# Patient Record
Sex: Female | Born: 1970 | Race: White | Hispanic: No | Marital: Married | Smoking: Former smoker
Health system: Southern US, Community
[De-identification: ages and names within clinical notes are randomized; demographics above are authoritative.]

## PROBLEM LIST (undated history)

## (undated) DIAGNOSIS — F32A Depression, unspecified: Secondary | ICD-10-CM

## (undated) DIAGNOSIS — I10 Essential (primary) hypertension: Secondary | ICD-10-CM

## (undated) DIAGNOSIS — I8290 Acute embolism and thrombosis of unspecified vein: Secondary | ICD-10-CM

## (undated) DIAGNOSIS — G43909 Migraine, unspecified, not intractable, without status migrainosus: Secondary | ICD-10-CM

## (undated) DIAGNOSIS — E785 Hyperlipidemia, unspecified: Secondary | ICD-10-CM

## (undated) DIAGNOSIS — F329 Major depressive disorder, single episode, unspecified: Secondary | ICD-10-CM

## (undated) HISTORY — DX: Depression, unspecified: F32.A

## (undated) HISTORY — DX: Hyperlipidemia, unspecified: E78.5

## (undated) HISTORY — DX: Acute embolism and thrombosis of unspecified vein: I82.90

## (undated) HISTORY — PX: TUBAL LIGATION: SHX77

---
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
## 2001-03-27 ENCOUNTER — Other Ambulatory Visit: Admission: RE | Admit: 2001-03-27 | Discharge: 2001-03-27 | Payer: Self-pay | Admitting: Obstetrics & Gynecology

## 2002-07-09 ENCOUNTER — Other Ambulatory Visit: Admission: RE | Admit: 2002-07-09 | Discharge: 2002-07-09 | Payer: Self-pay | Admitting: Obstetrics & Gynecology

## 2006-08-30 ENCOUNTER — Other Ambulatory Visit: Admission: RE | Admit: 2006-08-30 | Discharge: 2006-08-30 | Payer: Self-pay | Admitting: Obstetrics & Gynecology

## 2006-10-22 ENCOUNTER — Ambulatory Visit (HOSPITAL_BASED_OUTPATIENT_CLINIC_OR_DEPARTMENT_OTHER): Admission: RE | Admit: 2006-10-22 | Discharge: 2006-10-22 | Payer: Self-pay | Admitting: Obstetrics & Gynecology

## 2015-10-16 ENCOUNTER — Encounter (HOSPITAL_BASED_OUTPATIENT_CLINIC_OR_DEPARTMENT_OTHER): Payer: Self-pay | Admitting: *Deleted

## 2015-10-16 ENCOUNTER — Emergency Department (HOSPITAL_BASED_OUTPATIENT_CLINIC_OR_DEPARTMENT_OTHER)
Admission: EM | Admit: 2015-10-16 | Discharge: 2015-10-16 | Disposition: A | Discharge status: Home / Self Care | Payer: Managed Care, Other (non HMO) | Attending: Emergency Medicine | Admitting: Emergency Medicine

## 2015-10-16 DIAGNOSIS — G43809 Other migraine, not intractable, without status migrainosus: Secondary | ICD-10-CM | POA: Insufficient documentation

## 2015-10-16 DIAGNOSIS — R51 Headache: Secondary | ICD-10-CM | POA: Diagnosis present

## 2015-10-16 DIAGNOSIS — R6883 Chills (without fever): Secondary | ICD-10-CM | POA: Insufficient documentation

## 2015-10-16 DIAGNOSIS — F17200 Nicotine dependence, unspecified, uncomplicated: Secondary | ICD-10-CM | POA: Diagnosis not present

## 2015-10-16 HISTORY — DX: Migraine, unspecified, not intractable, without status migrainosus: G43.909

## 2015-10-16 MED ORDER — SODIUM CHLORIDE 0.9 % IV BOLUS (SEPSIS)
500.0000 mL | Freq: Once | INTRAVENOUS | Status: AC
  Administered 2015-10-16: 500 mL via INTRAVENOUS

## 2015-10-16 MED ORDER — KETOROLAC TROMETHAMINE 30 MG/ML IJ SOLN
30.0000 mg | Freq: Once | INTRAMUSCULAR | Status: AC
  Administered 2015-10-16: 30 mg via INTRAVENOUS
  Filled 2015-10-16: qty 1

## 2015-10-16 MED ORDER — PROCHLORPERAZINE EDISYLATE 5 MG/ML IJ SOLN
10.0000 mg | Freq: Once | INTRAMUSCULAR | Status: AC
  Administered 2015-10-16: 10 mg via INTRAVENOUS
  Filled 2015-10-16: qty 2

## 2015-10-16 NOTE — ED Notes (Signed)
Denies any nausea at this time

## 2015-10-16 NOTE — ED Notes (Signed)
Sensitive to light

## 2015-10-16 NOTE — ED Provider Notes (Signed)
CSN: [id number]     Arrival date & time 10/16/15  0754 History   First MD Initiated Contact with Patient 10/16/15 0827     Chief Complaint  Patient presents with  . Headache      Patient is a 45 y.o. female presenting with headaches. The history is provided by the patient.  Headache Associated symptoms: photophobia   Associated symptoms: no abdominal pain, no back pain, no diarrhea, no eye pain, no nausea, no neck stiffness, no numbness, no vomiting and no weakness   Patient had a throbbing frontal headache for the last 4 days. Began on Thursday today is Sunday. She began have some vomiting now. History of migraines and states this feels similar to that. No fevers but states she had had some chills. No diarrhea. No visual changes. Slight photophobia. States these headaches are more frequent with her menses.   Past Medical History  Diagnosis Date  . Migraine    Past Surgical History  Procedure Laterality Date  . Tubal ligation     History reviewed. No pertinent family history. Social History  Substance Use Topics  . Smoking status: Current Every Day Smoker -- 0.50 packs/day  . Smokeless tobacco: None  . Alcohol Use: No   OB History    No data available     Review of Systems  Constitutional: Positive for chills. Negative for activity change and appetite change.  Eyes: Positive for photophobia. Negative for pain and visual disturbance.  Respiratory: Negative for chest tightness and shortness of breath.   Cardiovascular: Negative for chest pain and leg swelling.  Gastrointestinal: Negative for nausea, vomiting, abdominal pain and diarrhea.  Genitourinary: Negative for flank pain.  Musculoskeletal: Negative for back pain and neck stiffness.  Skin: Negative for rash.  Neurological: Positive for headaches. Negative for weakness and numbness.  Psychiatric/Behavioral: Negative for behavioral problems.      Allergies  Review of patient's allergies indicates no known  allergies.  Home Medications   Prior to Admission medications   Medication Sig Start Date End Date Taking? Authorizing Provider  promethazine (PHENERGAN) 12.5 MG tablet Take 12.5 mg by mouth every 6 (six) hours as needed for nausea or vomiting.   Yes Historical Provider, MD  SUMAtriptan (IMITREX) 50 MG tablet Take 50 mg by mouth every 2 (two) hours as needed for migraine or headache. May repeat in 2 hours if headache persists or recurs.   Yes Historical Provider, MD   BP 119/72 mmHg  Pulse 84  Temp(Src) 97.6 F (36.4 C) (Oral)  Resp 16  Ht 5\' 6"  (1.676 m)  Wt 160 lb (72.576 kg)  BMI 25.84 kg/m2  SpO2 100% Physical Exam  Constitutional: She appears well-developed and well-nourished.  HENT:  Head: Normocephalic and atraumatic.  Eyes: EOM are normal. Pupils are equal, round, and reactive to light.  Neck: Normal range of motion. Neck supple.  Cardiovascular: Normal rate, regular rhythm and normal heart sounds.   No murmur heard. Pulmonary/Chest: Effort normal and breath sounds normal. No respiratory distress. She has no wheezes. She has no rales.  Abdominal: Soft. Bowel sounds are normal. She exhibits no distension. There is no tenderness.  Musculoskeletal: Normal range of motion.  Neurological: She is alert.  Skin: Skin is warm.  Psychiatric: She has a normal mood and affect. Her speech is normal.  Nursing note and vitals reviewed.   ED Course  Procedures (including critical care time) Labs Review Labs Reviewed - No data to display  Imaging Review No results found.  I have personally reviewed and evaluated these images and lab results as part of my medical decision-making.   EKG Interpretation None      MDM   Final diagnoses:  Other migraine without status migrainosus, not intractable    Patient with headache. Typical migraine. Feels better after treatment will discharge home. Doubt other severe illness.    Davonna Belling, MD 10/16/15 [phone]

## 2015-10-16 NOTE — Discharge Instructions (Signed)

## 2015-10-16 NOTE — ED Notes (Signed)
States had onset of HA this past Thursday, awoke this am with some nausea.

## 2019-07-21 ENCOUNTER — Emergency Department (HOSPITAL_COMMUNITY)
Admission: EM | Admit: 2019-07-21 | Discharge: 2019-07-21 | Disposition: A | Discharge status: Home / Self Care | Payer: Managed Care, Other (non HMO) | Attending: Emergency Medicine | Admitting: Emergency Medicine

## 2019-07-21 ENCOUNTER — Emergency Department (HOSPITAL_COMMUNITY): Payer: Managed Care, Other (non HMO)

## 2019-07-21 ENCOUNTER — Other Ambulatory Visit: Payer: Self-pay

## 2019-07-21 DIAGNOSIS — Z20822 Contact with and (suspected) exposure to covid-19: Secondary | ICD-10-CM | POA: Diagnosis not present

## 2019-07-21 DIAGNOSIS — R079 Chest pain, unspecified: Secondary | ICD-10-CM

## 2019-07-21 DIAGNOSIS — R0789 Other chest pain: Secondary | ICD-10-CM | POA: Insufficient documentation

## 2019-07-21 DIAGNOSIS — R42 Dizziness and giddiness: Secondary | ICD-10-CM | POA: Diagnosis not present

## 2019-07-21 DIAGNOSIS — R5383 Other fatigue: Secondary | ICD-10-CM | POA: Diagnosis not present

## 2019-07-21 DIAGNOSIS — K449 Diaphragmatic hernia without obstruction or gangrene: Secondary | ICD-10-CM | POA: Insufficient documentation

## 2019-07-21 LAB — CBC
HCT: 45 % (ref 36.0–46.0)
Hemoglobin: 14.3 g/dL (ref 12.0–15.0)
MCH: 28.6 pg (ref 26.0–34.0)
MCHC: 31.8 g/dL (ref 30.0–36.0)
MCV: 90 fL (ref 80.0–100.0)
Platelets: 348 10*3/uL (ref 150–400)
RBC: 5 MIL/uL (ref 3.87–5.11)
RDW: 12.9 % (ref 11.5–15.5)
WBC: 12 10*3/uL — ABNORMAL HIGH (ref 4.0–10.5)
nRBC: 0 % (ref 0.0–0.2)

## 2019-07-21 LAB — URINALYSIS, ROUTINE W REFLEX MICROSCOPIC
Bacteria, UA: NONE SEEN
Bilirubin Urine: NEGATIVE
Glucose, UA: NEGATIVE mg/dL
Hgb urine dipstick: NEGATIVE
Ketones, ur: NEGATIVE mg/dL
Nitrite: NEGATIVE
Protein, ur: NEGATIVE mg/dL
Specific Gravity, Urine: 1.019 (ref 1.005–1.030)
pH: 5 (ref 5.0–8.0)

## 2019-07-21 LAB — BASIC METABOLIC PANEL
Anion gap: 12 (ref 5–15)
BUN: 10 mg/dL (ref 6–20)
CO2: 24 mmol/L (ref 22–32)
Calcium: 9.2 mg/dL (ref 8.9–10.3)
Chloride: 103 mmol/L (ref 98–111)
Creatinine, Ser: 0.96 mg/dL (ref 0.44–1.00)
GFR calc Af Amer: >60 mL/min (ref >60)
GFR calc non Af Amer: >60 mL/min (ref >60)
Glucose, Bld: 93 mg/dL (ref 70–99)
Potassium: 3.2 mmol/L — ABNORMAL LOW (ref 3.5–5.1)
Sodium: 139 mmol/L (ref 135–145)

## 2019-07-21 LAB — HEPATIC FUNCTION PANEL
ALT: 22 U/L (ref 0–44)
AST: 20 U/L (ref 15–41)
Albumin: 4.1 g/dL (ref 3.5–5.0)
Alkaline Phosphatase: 54 U/L (ref 38–126)
Bilirubin, Direct: <0.1 mg/dL (ref 0.0–0.2)
Total Bilirubin: 0.8 mg/dL (ref 0.3–1.2)
Total Protein: 8.3 g/dL — ABNORMAL HIGH (ref 6.5–8.1)

## 2019-07-21 LAB — I-STAT BETA HCG BLOOD, ED (MC, WL, AP ONLY): I-stat hCG, quantitative: <5 m[IU]/mL (ref <5)

## 2019-07-21 LAB — TROPONIN I (HIGH SENSITIVITY)
Troponin I (High Sensitivity): <2 ng/L (ref <18)
Troponin I (High Sensitivity): <2 ng/L (ref <18)

## 2019-07-21 LAB — TSH: TSH: 1.807 u[IU]/mL (ref 0.350–4.500)

## 2019-07-21 LAB — RESPIRATORY PANEL BY RT PCR (FLU A&B, COVID)
Influenza A by PCR: NEGATIVE
Influenza B by PCR: NEGATIVE
SARS Coronavirus 2 by RT PCR: NEGATIVE

## 2019-07-21 LAB — LACTIC ACID, PLASMA: Lactic Acid, Venous: 1.2 mmol/L (ref 0.5–1.9)

## 2019-07-21 MED ORDER — ALUM & MAG HYDROXIDE-SIMETH 200-200-20 MG/5ML PO SUSP
30.0000 mL | Freq: Once | ORAL | Status: AC
  Administered 2019-07-21: 30 mL via ORAL
  Filled 2019-07-21: qty 30

## 2019-07-21 MED ORDER — SODIUM CHLORIDE (PF) 0.9 % IJ SOLN
INTRAMUSCULAR | Status: AC
  Filled 2019-07-21: qty 50

## 2019-07-21 MED ORDER — IOHEXOL 350 MG/ML SOLN
100.0000 mL | Freq: Once | INTRAVENOUS | Status: AC | PRN
  Administered 2019-07-21: 100 mL via INTRAVENOUS

## 2019-07-21 MED ORDER — SODIUM CHLORIDE 0.9 % IV BOLUS
1000.0000 mL | Freq: Once | INTRAVENOUS | Status: AC
  Administered 2019-07-21: 1000 mL via INTRAVENOUS

## 2019-07-21 MED ORDER — OMEPRAZOLE 20 MG PO CPDR: 20.0000 mg | DELAYED_RELEASE_CAPSULE | Freq: Every day | ORAL | 0 refills | Status: DC

## 2019-07-21 MED ORDER — SODIUM CHLORIDE 0.9% FLUSH: 3.0000 mL | Freq: Once | INTRAVENOUS | Status: DC

## 2019-07-21 MED ORDER — LIDOCAINE VISCOUS HCL 2 % MT SOLN
15.0000 mL | Freq: Once | OROMUCOSAL | Status: AC
  Administered 2019-07-21: 15 mL via ORAL
  Filled 2019-07-21: qty 15

## 2019-07-21 NOTE — Discharge Instructions (Signed)
Your work-up today was overall reassuring.  You had slightly low potassium but otherwise your labs were reassuring.  We did find evidence of a small hiatal hernia which may have contributed to the chest discomfort and possible reflux.  Please take the Prilosec to help prevent this from worsening and please try and stay hydrated.  Please rest.  Please follow-up with your primary doctor to discuss further anticoagulation management for your DVT.  Fortunately, there was no evidence of blood clot in your lungs at this time.    If any symptoms change or worsen, please return to the nearest emergency department.

## 2019-07-21 NOTE — ED Provider Notes (Signed)
[facility] DEPT Provider Note   CSN: [id number] Arrival date & time: 07/21/19  1431     History Chief Complaint  Patient presents with  . Dizziness    Hx DVT    Jeanne Payne is a 49 y.o. female.  The history is provided by the patient and medical records. No language interpreter was used.  Chest Pain Pain location:  Substernal area Pain quality: aching and pressure   Pain radiates to:  Does not radiate Pain severity:  Moderate Onset quality:  Gradual Duration:  4 days Timing:  Constant Progression:  Waxing and waning Chronicity:  Recurrent Relieved by:  Nothing Worsened by:  Exertion and certain positions Ineffective treatments:  None tried Associated symptoms: cough (dry), fatigue and shortness of breath   Associated symptoms: no abdominal pain, no anxiety, no back pain, no diaphoresis, no dizziness, no fever, no headache, no lower extremity edema, no nausea, no numbness, no palpitations, no vomiting and no weakness   Risk factors: high cholesterol and prior DVT/PE   Risk factors: not female        No past medical history on file.  There are no problems to display for this patient.     OB History   No obstetric history on file.     No family history on file.  Social History   Tobacco Use  . Smoking status: Not on file  Substance Use Topics  . Alcohol use: Not on file  . Drug use: Not on file    Home Medications Prior to Admission medications   Not on File    Allergies    Patient has no allergy information on record.  Review of Systems   Review of Systems  Constitutional: Positive for fatigue. Negative for chills, diaphoresis and fever.  HENT: Negative for congestion.   Eyes: Negative for visual disturbance.  Respiratory: Positive for cough (dry), chest tightness and shortness of breath. Negative for wheezing.   Cardiovascular: Positive for chest pain. Negative for palpitations.  Gastrointestinal:  Negative for abdominal pain, constipation, diarrhea, nausea and vomiting.  Genitourinary: Positive for decreased urine volume. Negative for dysuria and frequency.  Musculoskeletal: Negative for back pain, neck pain and neck stiffness.  Skin: Negative for rash and wound.  Neurological: Positive for light-headedness. Negative for dizziness, syncope, weakness, numbness and headaches.  Psychiatric/Behavioral: Negative for agitation.  All other systems reviewed and are negative.   Physical Exam Updated Vital Signs BP (!) 141/91 (BP Location: Left Arm)   Pulse 79   Temp 98.1 F (36.7 C) (Oral)   Resp 20   Ht 5\' 6"  (1.676 m)   Wt 88.5 kg   SpO2 100%   BMI 31.47 kg/m   Physical Exam Vitals and nursing note reviewed.  Constitutional:      General: She is not in acute distress.    Appearance: Normal appearance. She is well-developed. She is not ill-appearing, toxic-appearing or diaphoretic.  HENT:     Head: Normocephalic and atraumatic.     Right Ear: External ear normal.     Left Ear: External ear normal.     Nose: Nose normal. No congestion or rhinorrhea.     Mouth/Throat:     Mouth: Mucous membranes are dry.     Pharynx: No oropharyngeal exudate or posterior oropharyngeal erythema.  Eyes:     Extraocular Movements: Extraocular movements intact.     Conjunctiva/sclera: Conjunctivae normal.     Pupils: Pupils are equal, round, and reactive to light.  Cardiovascular:     Rate and Rhythm: Normal rate and regular rhythm.     Pulses: Normal pulses.     Heart sounds: No murmur.  Pulmonary:     Effort: Pulmonary effort is normal. No respiratory distress.     Breath sounds: Normal breath sounds. No stridor. No wheezing, rhonchi or rales.  Chest:     Chest wall: No tenderness.  Abdominal:     General: Abdomen is flat. There is no distension.     Tenderness: There is no abdominal tenderness. There is no right CVA tenderness, left CVA tenderness or rebound.  Musculoskeletal:         General: Tenderness present. No signs of injury.     Cervical back: Normal range of motion and neck supple. No tenderness.     Right lower leg: No edema.     Left lower leg: No edema.  Skin:    General: Skin is warm.     Coloration: Skin is not pale.     Findings: No erythema or rash.  Neurological:     General: No focal deficit present.     Mental Status: She is alert and oriented to person, place, and time.     Sensory: No sensory deficit.     Motor: No weakness or abnormal muscle tone.     Deep Tendon Reflexes: Reflexes are normal and symmetric.  Psychiatric:        Mood and Affect: Mood normal.     ED Results / Procedures / Treatments   Labs (all labs ordered are listed, but only abnormal results are displayed) Labs Reviewed  BASIC METABOLIC PANEL - Abnormal; Notable for the following components:      Result Value   Potassium 3.2 (*)    All other components within normal limits  CBC - Abnormal; Notable for the following components:   WBC 12.0 (*)    All other components within normal limits  URINALYSIS, ROUTINE W REFLEX MICROSCOPIC - Abnormal; Notable for the following components:   APPearance HAZY (*)    Leukocytes,Ua SMALL (*)    All other components within normal limits  HEPATIC FUNCTION PANEL - Abnormal; Notable for the following components:   Total Protein 8.3 (*)    All other components within normal limits  RESPIRATORY PANEL BY RT PCR (FLU A&B, COVID)  URINE CULTURE  LACTIC ACID, PLASMA  TSH  I-STAT BETA HCG BLOOD, ED ([facility], [facility], [facility] ONLY)  TROPONIN I (HIGH SENSITIVITY)  TROPONIN I (HIGH SENSITIVITY)    EKG EKG Interpretation  Date/Time:  Tuesday July 21 2019 14:56:27 EST Ventricular Rate:  87 PR Interval:    QRS Duration: 93 QT Interval:  363 QTC Calculation: 437 R Axis:   54 Text Interpretation: Sinus rhythm Low voltage, precordial leads when comapred to prior, less wanderin gbaseline. No STEMI Confirmed by Antony Blackbird [id number]) on 07/21/2019  3:58:08 PM   Radiology CT Angio Chest PE W and/or Wo Contrast  Result Date: 07/21/2019 CLINICAL DATA:  Chest pain and shortness of breath EXAM: CT ANGIOGRAPHY CHEST WITH CONTRAST TECHNIQUE: Multidetector CT imaging of the chest was performed using the standard protocol during bolus administration of intravenous contrast. Multiplanar CT image reconstructions and MIPs were obtained to evaluate the vascular anatomy. CONTRAST:  136mL OMNIPAQUE IOHEXOL 350 MG/ML SOLN COMPARISON:  None. FINDINGS: Cardiovascular: There is no demonstrable pulmonary embolus. There is no thoracic aortic aneurysm or dissection. Visualized great vessels appear normal. There is no pericardial effusion or pericardial thickening. Mediastinum/Nodes: Visualized  thyroid appears unremarkable. No evident thoracic adenopathy. There is a small hiatal hernia. Lungs/Pleura: There is slight scarring in the extreme left apex. No edema or airspace opacity evident. No pleural effusions. Upper Abdomen: Visualized upper abdominal structures appear unremarkable. Musculoskeletal: No blastic or lytic bone lesions. No chest wall lesions. Review of the MIP images confirms the above findings. IMPRESSION: 1. No demonstrable pulmonary embolus. No thoracic aortic aneurysm or dissection. 2.  No edema or airspace opacity.  No pleural effusions. 3.  Small hiatal hernia. 4.  No evident adenopathy. Electronically Signed   By: Lowella Grip III M.D.   On: 07/21/2019 19:50    Procedures Procedures (including critical care time)  Medications Ordered in ED Medications  sodium chloride (PF) 0.9 % injection (has no administration in time range)  sodium chloride 0.9 % bolus 1,000 mL (0 mLs Intravenous Stopped 07/21/19 1947)  alum & mag hydroxide-simeth (MAALOX/MYLANTA) 200-200-20 MG/5ML suspension 30 mL (30 mLs Oral Given 07/21/19 1649)    And  lidocaine (XYLOCAINE) 2 % viscous mouth solution 15 mL (15 mLs Oral Given 07/21/19 1649)  iohexol (OMNIPAQUE) 350 MG/ML  injection 100 mL (100 mLs Intravenous Contrast Given 07/21/19 1937)    ED Course  I have reviewed the triage vital signs and the nursing notes.  Pertinent labs & imaging results that were available during my care of the patient were reviewed by me and considered in my medical decision making (see chart for details).    MDM Rules/Calculators/A&P                      Jeanne Payne is a 49 y.o. 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  Patient reports that she was diagnosed with DVT around 2 weeks ago and has been on Xarelto since.  She reports that several days ago she missed a dose of Xarelto.  She reports that for the last few days she is also had onset of more discomfort in her chest with shortness of breath.  She reports has had this discomfort on and off for weeks but it worsened since she missed a Xarelto dose.  She also says that in the past she has had some thyroid problems but is no longer on Synthroid as her levels "improved".  She says that she is also had malaise and aches all over her body.  She denies any fevers or chills.  She reports that her son's girlfriend had Covid recently but is now back at work.  She denies any other Covid contacts.  She does report a mild dry cough.  she denies any nausea, vomiting, constipation, or diarrhea.  She does report she is had decreased urination and her mouth feels very dry.  She denies any dark tarry stool or history of GI bleeds.  On exam, lungs are clear and chest is slightly tender to palpation.  Abdomen is nontender.  Good pulses in all extremities.  Mild tenderness in her calves but she had good sensation and strength.  Back nontender.  No CVA tenderness.  Vital signs reassuring on arrival.  EKG shows no STEMI.  Clinically I suspect that patient has mild reflux  causing her chest discomfort as she says she has had that in the past and is worse at times when she lays flat.  However, given her recent diagnosis of DVT, missing anticoagulation, and the  discomfort in her chest, shortness of breath, and lightheadedness, we will get CT scan to rule out PE.  We will also get screening labs as her fatigue worsening after strength relative could be indicative of a symptomatic anemia with occult GI bleed.  We will also test her thyroid given her history of this and being off of Synthroid.  We will give her some fluids given the reported lightheadedness and near syncope with standing and ambulation.  We will likely check orthostatics after rehydration.  If work-up is complete reassuring and she is feeling better after rehydration, dissipate discharge home however if we discover abnormalities, will reassess patient.  Patient was feeling better after work-up.  CT scan shows no PE.  Covid test negative.  Delta troponin negative.  Flu test negative.  CT scan did show evidence of small hiatal hernia which on reassessment patient reports he does feel some nausea and some reflux type symptoms at times.  Suspect this may be contributing.  With no evidence of anemia on her blood work and no PE.  We feel she is safe for discharge home.  She will follow-up with PCP understood return precautions.  She was given prescription for Prilosec for the likely reflux and will follow up.  She had no other questions or concerns and was discharged in good condition.  Final Clinical Impression(s) / ED Diagnoses Final diagnoses:  None    Rx / DC Orders ED Discharge Orders    None     Clinical Impression: 1. Chest pain, unspecified type   2. Lightheaded   3. Fatigue, unspecified type   4. Hiatal hernia     Disposition: Discharge  Condition: Good  I have discussed the results, Dx and Tx plan with the pt(& family if present). He/she/they expressed understanding and agree(s) with the  plan. Discharge instructions discussed at great length. Strict return precautions discussed and pt &/or family have verbalized understanding of the instructions. No further questions at time of discharge.    Discharge Medication List as of 07/21/2019  9:46 PM    START taking these medications   Details  omeprazole (PRILOSEC) 20 MG capsule Take 1 capsule (20 mg total) by mouth daily., Starting Tue 07/21/2019, Print        Follow Up: [facility] [address] [place] [phone] Schedule an appointment as soon as possible for a visit    [facility] [place] [id number] [place]       Hajer Dwyer, Gwenyth Allegra, MD 07/22/19 [phone]

## 2019-07-21 NOTE — ED Triage Notes (Addendum)
Pt arrived ambulatory into the ED from home CC dizziness, fatigue, left arm pain X2 days. Pt reports "concern of blood clot moving". Pt reports when going from sitting to standing feels like she almost passes out "esp in the shower this morning". A/oX4   Hx [place] blood clot right leg X 2 weeks PCP aware and prescribed Xerolto. High cholesterol.

## 2019-07-22 ENCOUNTER — Encounter: Payer: Self-pay | Admitting: Family Medicine

## 2019-07-22 ENCOUNTER — Encounter (HOSPITAL_BASED_OUTPATIENT_CLINIC_OR_DEPARTMENT_OTHER): Payer: Self-pay | Admitting: *Deleted

## 2019-07-22 ENCOUNTER — Ambulatory Visit: Payer: Managed Care, Other (non HMO) | Admitting: Family Medicine

## 2019-07-22 VITALS — BP 124/70 | HR 88 | Resp 12 | Ht 66.0 in | Wt 202.0 lb

## 2019-07-22 DIAGNOSIS — E785 Hyperlipidemia, unspecified: Secondary | ICD-10-CM | POA: Diagnosis not present

## 2019-07-22 DIAGNOSIS — M25572 Pain in left ankle and joints of left foot: Secondary | ICD-10-CM | POA: Diagnosis not present

## 2019-07-22 DIAGNOSIS — I824Y1 Acute embolism and thrombosis of unspecified deep veins of right proximal lower extremity: Secondary | ICD-10-CM

## 2019-07-22 DIAGNOSIS — E876 Hypokalemia: Secondary | ICD-10-CM

## 2019-07-22 LAB — URINE CULTURE: Culture: NO GROWTH

## 2019-07-22 MED ORDER — RIVAROXABAN 20 MG PO TABS: 20.0000 mg | ORAL_TABLET | Freq: Every day | ORAL | 0 refills | Status: DC

## 2019-07-22 NOTE — Patient Instructions (Signed)
A few things to remember from today's visit:   Hypokalemia  Hyperlipidemia, unspecified hyperlipidemia type  Left ankle pain, unspecified chronicity  Acute deep vein thrombosis (DVT) of proximal end of right lower extremity (HCC)   Deep Vein Thrombosis  Deep vein thrombosis (DVT) is a condition in which a blood clot forms in a deep vein, such as a lower leg, thigh, or arm vein. A clot is blood that has thickened into a gel or solid. This condition is dangerous. It can lead to serious and even life-threatening complications if the clot travels to the lungs and causes a blockage (pulmonary embolism). It can also damage veins in the leg. This can result in leg pain, swelling, discoloration, and sores (post-thrombotic syndrome). What are the causes? This condition may be caused by:  A slowdown of blood flow.  Damage to a vein.  A condition that causes blood to clot more easily, such as an inherited clotting disorder. What increases the risk? The following factors may make you more likely to develop this condition:  Being overweight.  Being older, especially over age 14.  Sitting or lying down for more than four hours.  Being in the hospital.  Lack of physical activity (sedentary lifestyle).  Pregnancy, being in childbirth, or having recently given birth.  Taking medicines that contain estrogen, such as medicines to prevent pregnancy.  Smoking.  A history of any of the following: ? Blood clots or a blood clotting disease. ? Peripheral vascular disease. ? Inflammatory bowel disease. ? Cancer. ? Heart disease. ? Genetic conditions that affect how your blood clots, such as Factor V Leiden mutation. ? Neurological diseases that affect your legs (leg paresis). ? A recent injury, such as a car accident. ? Major or lengthy surgery. ? A central line placed inside a large vein. What are the signs or symptoms? Symptoms of this condition include:  Swelling, pain, or tenderness  in an arm or leg.  Warmth, redness, or discoloration in an arm or leg. If the clot is in your leg, symptoms may be more noticeable or worse when you stand or walk. Some people may not develop any symptoms. How is this diagnosed? This condition is diagnosed with:  A medical history and physical exam.  Tests, such as: ? Blood tests. These are done to check how well your blood clots. ? Ultrasound. This is done to check for clots. ? Venogram. For this test, contrast dye is injected into a vein and X-rays are taken to check for any clots. How is this treated? Treatment for this condition depends on:  The cause of your DVT.  Your risk for bleeding or developing more clots.  Any other medical conditions that you have. Treatment may include:  Taking a blood thinner (anticoagulant). This type of medicine prevents clots from forming. It may be taken by mouth, injected under the skin, or injected through an IV (catheter).  Injecting clot-dissolving medicines into the affected vein (catheter-directed thrombolysis).  Having surgery. Surgery may be done to: ? Remove the clot. ? Place a filter in a large vein to catch blood clots before they reach the lungs. Some treatments may be continued for up to six months. Follow these instructions at home: If you are taking blood thinners:  Take the medicine exactly as told by your health care provider. Some blood thinners need to be taken at the same time every day. Do not skip a dose.  Talk with your health care provider before you take any medicines  that contain aspirin or NSAIDs. These medicines increase your risk for dangerous bleeding.  Ask your health care provider about foods and drugs that could change the way the medicine works (may interact). Avoid those things if your health care provider tells you to do so.  Blood thinners can cause easy bruising and may make it difficult to stop bleeding. Because of this: ? Be very careful when using  knives, scissors, or other sharp objects. ? Use an electric razor instead of a blade. ? Avoid activities that could cause injury or bruising, and follow instructions about how to prevent falls.  Wear a medical alert bracelet or carry a card that lists what medicines you take. General instructions  Take over-the-counter and prescription medicines only as told by your health care provider.  Return to your normal activities as told by your health care provider. Ask your health care provider what activities are safe for you.  Wear compression stockings if recommended by your health care provider.  Keep all follow-up visits as told by your health care provider. This is important. How is this prevented? To lower your risk of developing this condition again:  For 30 or more minutes every day, do an activity that: ? Involves moving your arms and legs. ? Increases your heart rate.  When traveling for longer than four hours: ? Exercise your arms and legs every hour. ? Drink plenty of water. ? Avoid drinking alcohol.  Avoid sitting or lying for a long time without moving your legs.  If you have surgery or you are hospitalized, ask about ways to prevent blood clots. These may include taking frequent walks or using anticoagulants.  Stay at a healthy weight.  If you are a woman who is older than age 41, avoid unnecessary use of medicines that contain estrogen, such as some birth control pills.  Do not use any products that contain nicotine or tobacco, such as cigarettes and e-cigarettes. This is especially important if you take estrogen medicines. If you need help quitting, ask your health care provider. Contact a health care provider if:  You miss a dose of your blood thinner.  Your menstrual period is heavier than usual.  You have unusual bruising. Get help right away if:  You have: ? New or increased pain, swelling, or redness in an arm or leg. ? Numbness or tingling in an arm or  leg. ? Shortness of breath. ? Chest pain. ? A rapid or irregular heartbeat. ? A severe headache or confusion. ? A cut that will not stop bleeding.  There is blood in your vomit, stool, or urine.  You have a serious fall or accident, or you hit your head.  You feel light-headed or dizzy.  You cough up blood. These symptoms may represent a serious problem that is an emergency. Do not wait to see if the symptoms will go away. Get medical help right away. Call your local emergency services (911 in the [place] Do not drive yourself to the hospital. Summary  Deep vein thrombosis (DVT) is a condition in which a blood clot forms in a deep vein, such as a lower leg, thigh, or arm vein.  Symptoms can include swelling, warmth, pain, and redness in your leg or arm.  This condition may be treated with a blood thinner (anticoagulant medicine), medicine that is injected to dissolve blood clots,compression stockings, or surgery.  If you are prescribed blood thinners, take them exactly as told. This information is not intended to replace advice  given to you by your health care provider. Make sure you discuss any questions you have with your health care provider. Document Revised: 05/24/2017 Document Reviewed: 11/09/2016 Elsevier Patient Education  [employer].  Please be sure medication list is accurate. If a new problem present, please set up appointment sooner than planned today.

## 2019-07-22 NOTE — Progress Notes (Signed)
HPI:   [name] is a 49 y.o. female, who is here today to establish care.  Former PCP: Dr Benjamine Mola. Last preventive routine visit: 05/2019.  Chronic medical problems:Migraines,HLD,and depression among some.  Recently dx'ed with DVT She presented to her pcp c/o LE pain L>R, that was going on for a few weeks, since 04/2019.  07/09/19 LE vein US showed RLE proximal DVT. She is on Xarelto 15 mg bid.  No known risk factors for DVT. No prior hx. No tobacco use.  Negative for travel,long period of immobility,surgery,hormonal treatment,or leg trauma. She did not have edema or erythema on area affected. She is up to date with all cancer screening prevention.  Father with Hx of PE. She was evaluated recently in the ER due to 4 days of CP, 07/21/19.  Substernal achy/presure CP,not radiated,intermittent, and associated with cough,SOB,and fatigue. Today symptoms have resolved. Lab Results  Component Value Date   CREATININE 0.96 07/21/2019   BUN 10 07/21/2019   NA 139 07/21/2019   K 3.2 (L) 07/21/2019   CL 103 07/21/2019   CO2 24 07/21/2019   Lab Results  Component Value Date   TSH 1.807 07/21/2019   Lab Results  Component Value Date   WBC 12.0 (H) 07/21/2019   HGB 14.3 07/21/2019   HCT 45.0 07/21/2019   MCV 90.0 07/21/2019   PLT 348 07/21/2019   Lab Results  Component Value Date   ALT 22 07/21/2019   AST 20 07/21/2019   ALKPHOS 54 07/21/2019   BILITOT 0.8 07/21/2019   Chest CTA negative for PE. 1. No demonstrable pulmonary embolus. No thoracic aortic aneurysm or dissection. 2.  No edema or airspace opacity.  No pleural effusions. 3.  Small hiatal hernia. 4.  No evident adenopathy.  EKG: SR,normal axis, ? IVCD. No other EKG for comparison.  -She has had left ankle pain, mild, and intermittent. No edema,erythema,or limitation of ROM. No hx of trauma. Exacerbated by certain movements and alleviated by rest.  HLD: Last FLP 05/2019. She  is on Lipitor 40 mg daily. Negative for dyspnea, palpitation, claudication, focal weakness, or urinary symptoms.   Review of Systems  Constitutional: Negative for activity change, appetite change, fatigue and fever.  HENT: Negative for mouth sores, nosebleeds and sore throat.   Eyes: Negative for redness and visual disturbance.  Respiratory: Negative for cough and wheezing.   Gastrointestinal: Negative for abdominal pain, nausea and vomiting.       Negative for changes in bowel habits.  Genitourinary: Negative for decreased urine volume and hematuria.  Neurological: Negative for syncope, numbness and headaches.  Rest see pertinent positives and negatives per HPI.   Current Outpatient Medications on File Prior to Visit  Medication Sig Dispense Refill  . atorvastatin (LIPITOR) 40 MG tablet Take 40 mg by mouth daily.    . cholecalciferol (VITAMIN D3) 25 MCG (1000 UNIT) tablet Take 1,000 Units by mouth daily.    Marland Kitchen omeprazole (PRILOSEC) 20 MG capsule Take 1 capsule (20 mg total) by mouth daily. 30 capsule 0  . Rivaroxaban (XARELTO) 15 MG TABS tablet Take 15 mg by mouth 2 (two) times daily with a meal.    . SUMAtriptan (IMITREX) 25 MG tablet Take 25 mg by mouth every 2 (two) hours as needed for migraine. May repeat in 2 hours if headache persists or recurs.    . topiramate (TOPAMAX) 50 MG tablet Take 50 mg by mouth every evening.    . promethazine (PHENERGAN) 12.5  MG tablet Take 12.5 mg by mouth every 6 (six) hours as needed for nausea or vomiting.    . SUMAtriptan (IMITREX) 50 MG tablet Take 50 mg by mouth every 2 (two) hours as needed for migraine or headache. May repeat in 2 hours if headache persists or recurs.     No current facility-administered medications on file prior to visit.     Past Medical History:  Diagnosis Date  . Depression   . Hyperlipidemia   . Migraine    No Known Allergies  Family History  Problem Relation Age of Onset  . Hypertension Mother   . Depression  Mother   . Alcohol abuse Father   . Asthma Father   . Cancer Father   . COPD Father   . Diabetes Father   . Hearing loss Father   . Heart disease Father   . Depression Sister   . Early death Sister   . Alcohol abuse Brother   . Drug abuse Brother   . Arthritis Paternal Grandmother   . COPD Paternal Grandmother   . Hearing loss Paternal Grandmother     Social History   Socioeconomic History  . Marital status: Married    Spouse name: Not on file  . Number of children: Not on file  . Years of education: Not on file  . Highest education level: Not on file  Occupational History  . Not on file  Tobacco Use  . Smoking status: Former Research scientist (life sciences)  . Smokeless tobacco: Never Used  Substance and Sexual Activity  . Alcohol use: No  . Drug use: Not on file  . Sexual activity: Not on file  Other Topics Concern  . Not on file  Social History Narrative   ** Merged History Encounter **       Social Determinants of Health   Financial Resource Strain:   . Difficulty of Paying Living Expenses: Not on file  Food Insecurity:   . Worried About Charity fundraiser in the Last Year: Not on file  . Ran Out of Food in the Last Year: Not on file  Transportation Needs:   . Lack of Transportation (Medical): Not on file  . Lack of Transportation (Non-Medical): Not on file  Physical Activity:   . Days of Exercise per Week: Not on file  . Minutes of Exercise per Session: Not on file  Stress:   . Feeling of Stress : Not on file  Social Connections:   . Frequency of Communication with Friends and Family: Not on file  . Frequency of Social Gatherings with Friends and Family: Not on file  . Attends Religious Services: Not on file  . Active Member of Clubs or Organizations: Not on file  . Attends Archivist Meetings: Not on file  . Marital Status: Not on file    Vitals:   07/22/19 1119  BP: 124/70  Pulse: 88  Resp: 12  SpO2: 97%    Body mass index is 32.6 kg/m.  Physical  Exam  Nursing note and vitals reviewed. Constitutional: She is oriented to person, place, and time. She appears well-developed. No distress.  HENT:  Head: Normocephalic and atraumatic.  Mouth/Throat: Oropharynx is clear and moist and mucous membranes are normal.  Eyes: Pupils are equal, round, and reactive to light. Conjunctivae are normal.  Cardiovascular: Normal rate and regular rhythm.  No murmur heard. Pulses:      Dorsalis pedis pulses are 2+ on the right side and 2+ on the  left side.  Respiratory: Effort normal and breath sounds normal. No respiratory distress.  GI: Soft. She exhibits no mass. There is no hepatomegaly. There is no abdominal tenderness.  Musculoskeletal:        General: No edema.     Left ankle: No swelling or deformity. Tenderness present over the medial malleolus, AITF ligament and posterior TF ligament. No proximal fibula tenderness. Normal range of motion.     Left Achilles Tendon: No pain or defects. Thompson's test negative.  Lymphadenopathy:    She has no cervical adenopathy.  Neurological: She is alert and oriented to person, place, and time. She has normal strength. No cranial nerve deficit. Gait normal.  Skin: Skin is warm. No rash noted. No erythema.  Psychiatric: She has a normal mood and affect.  Well groomed, good eye contact.   ASSESSMENT AND PLAN:  Ms. Katherene was seen today for establish care and discuss dvt.  Diagnoses and all orders for this visit:  Acute deep vein thrombosis (DVT) of proximal end of right lower extremity (HCC) No known risk factors. We discussed recommendations in regard to anticoagulation and side effects. We will plan on 6 months for now. Next visit will discuss the need for evaluating prot S and C deficiency and Leiden V mutation.  -     rivaroxaban (XARELTO) 20 MG TABS tablet; Take 1 tablet (20 mg total) by mouth daily with supper.  Hypokalemia Mild. K+ rich diet recommended for now.  Hyperlipidemia, unspecified  hyperlipidemia type We will try to obtain copy of last done in 05/2019. Continue Atorvastatin 40 mg daily. F/U in 05/2020.  Left ankle pain, unspecified chronicity ? Ankle sprain. I do not think imaging is needed at this time. ABC PT exercises recommended.   Return in about 3 months (around 10/20/2019).   Jeanne Payne G. [place], MD  [facility]. [place] office.

## 2019-10-19 ENCOUNTER — Other Ambulatory Visit: Payer: Self-pay

## 2019-10-20 ENCOUNTER — Encounter: Payer: Self-pay | Admitting: Family Medicine

## 2019-10-20 ENCOUNTER — Ambulatory Visit (INDEPENDENT_AMBULATORY_CARE_PROVIDER_SITE_OTHER): Payer: Managed Care, Other (non HMO) | Admitting: Family Medicine

## 2019-10-20 VITALS — BP 118/70 | HR 84 | Temp 97.9°F | Resp 12 | Ht 66.0 in | Wt 205.1 lb

## 2019-10-20 DIAGNOSIS — M79605 Pain in left leg: Secondary | ICD-10-CM

## 2019-10-20 DIAGNOSIS — G43109 Migraine with aura, not intractable, without status migrainosus: Secondary | ICD-10-CM

## 2019-10-20 DIAGNOSIS — E876 Hypokalemia: Secondary | ICD-10-CM

## 2019-10-20 DIAGNOSIS — K219 Gastro-esophageal reflux disease without esophagitis: Secondary | ICD-10-CM | POA: Diagnosis not present

## 2019-10-20 DIAGNOSIS — E785 Hyperlipidemia, unspecified: Secondary | ICD-10-CM

## 2019-10-20 DIAGNOSIS — M79604 Pain in right leg: Secondary | ICD-10-CM

## 2019-10-20 DIAGNOSIS — F419 Anxiety disorder, unspecified: Secondary | ICD-10-CM

## 2019-10-20 LAB — COMPREHENSIVE METABOLIC PANEL
ALT: 18 U/L (ref 0–35)
AST: 20 U/L (ref 0–37)
Albumin: 3.9 g/dL (ref 3.5–5.2)
Alkaline Phosphatase: 50 U/L (ref 39–117)
BUN: 12 mg/dL (ref 6–23)
CO2: 26 mEq/L (ref 19–32)
Calcium: 9.2 mg/dL (ref 8.4–10.5)
Chloride: 105 mEq/L (ref 96–112)
Creatinine, Ser: 0.85 mg/dL (ref 0.40–1.20)
GFR: 71.25 mL/min (ref >60.00)
Glucose, Bld: 93 mg/dL (ref 70–99)
Potassium: 4.4 mEq/L (ref 3.5–5.1)
Sodium: 139 mEq/L (ref 135–145)
Total Bilirubin: 0.2 mg/dL (ref 0.2–1.2)
Total Protein: 6.9 g/dL (ref 6.0–8.3)

## 2019-10-20 LAB — LIPID PANEL
Cholesterol: 211 mg/dL — ABNORMAL HIGH (ref 0–200)
HDL: 44.2 mg/dL (ref >39.00)
LDL Cholesterol: 133 mg/dL — ABNORMAL HIGH (ref 0–99)
NonHDL: 166.55
Total CHOL/HDL Ratio: 5
Triglycerides: 170 mg/dL — ABNORMAL HIGH (ref 0.0–149.0)
VLDL: 34 mg/dL (ref 0.0–40.0)

## 2019-10-20 LAB — CBC
HCT: 41.6 % (ref 36.0–46.0)
Hemoglobin: 14 g/dL (ref 12.0–15.0)
MCHC: 33.7 g/dL (ref 30.0–36.0)
MCV: 88.1 fl (ref 78.0–100.0)
Platelets: 283 10*3/uL (ref 150.0–400.0)
RBC: 4.72 Mil/uL (ref 3.87–5.11)
RDW: 14.1 % (ref 11.5–15.5)
WBC: 7.5 10*3/uL (ref 4.0–10.5)

## 2019-10-20 MED ORDER — ESOMEPRAZOLE MAGNESIUM 40 MG PO CPDR: 40.0000 mg | DELAYED_RELEASE_CAPSULE | Freq: Every day | ORAL | 3 refills | Status: DC

## 2019-10-20 MED ORDER — SUMATRIPTAN SUCCINATE 100 MG PO TABS: 100.0000 mg | ORAL_TABLET | Freq: Every day | ORAL | 3 refills | Status: DC | PRN

## 2019-10-20 MED ORDER — ESCITALOPRAM OXALATE 20 MG PO TABS: 20.0000 mg | ORAL_TABLET | Freq: Every day | ORAL | 1 refills | Status: DC

## 2019-10-20 NOTE — Patient Instructions (Signed)
Today Nexium added. No changes in rest of medications.  GERD:  Avoid foods that make your symptoms worse, for example coffee, chocolate,pepermeint,alcohol, and greasy food. Raising the head of your bed about 6 inches may help with nocturnal symptoms.  Avoid tobacco use. Weight loss (if you are overweight). Avoid lying down for 3 hours after eating.  Instead 3 large meals daily try small and more frequent meals during the day.   You should be evaluated immediately if bloody vomiting, bloody stools, black stools (like tar), difficulty swallowing, food gets stuck on the way down or choking when eating. Abnormal weight loss or severe abdominal pain.  If symptoms are not resolved sometimes endoscopy is necessary.

## 2019-10-20 NOTE — Progress Notes (Signed)
HPI:  Jeanne Payne is a 49 y.o. female, who is here today for 3-4 months follow up.   She was last seen on 07/22/19.  -Requesting refills on Lexapro 20 mg. Hx of depression and anxiety. She has been on medication intermittently for years, stopped it 2-3 years ago,did not fell she longer needed it. Worsening anxiety. She was on Prozac before, did not help.  -Migraine headaches: She takes Imitrex 100 mg daily as needed. She is on Topamax 100 mg daily.  Headache exacerbated by lack of good sleep, sudden weather changes,stress,and/or around menstrual periods (lasts about 2 days.) Usually right temporo-frontal throbbing/sharp headache, 5/10. If she does not take Imitrex she will have nausea and sometimes vomiting. Headache has improved through the years. Years ago she had visual aura with every migraine,now it is seldom. Alleviated by sleeping in a dark/quite room. Takes Phenergan for nausea,not frequent because it causes drowsiness. She has seen neurologist a few years ago.  -LE pain, chronic. 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 L>R for about 5-6 months. She has not noted edema or erythema. Exacerbated by prolonged standing/waking. Alleviated by rest.  Negative for CP,palpitation,or dyspnea. No LE erythema.  -Getting "chocked" and retrosternal chest discomfort when swallowing medications. +Heartburn. She took Prilosec 40 mg ,did not help. She has tried Protonix , did not help either.  Problem exacerbated by certain foods.  Eating dinner late at night and sometimes goes to be < 3 hours after. She has not noted melena or abdominal pain.  HLD:She is on non pharmacologic treatment. She is on Lipitor 40 mg daily. Tolerating medication well.  HypoK+;  Component     Latest Ref Rng & Units 07/21/2019  Sodium     135 - 145 mEq/L 139  Potassium     3.5 - 5.1 mEq/L 3.2 (L)  Chloride     96 - 112 mEq/L 103  CO2     19 - 32 mEq/L  24  Glucose     70 - 99 mg/dL 93  BUN     6 - 23 mg/dL 10  Creatinine     0.40 - 1.20 mg/dL 0.96  Calcium     8.4 - 10.5 mg/dL 9.2  GFR, Est Non African American     >60 mL/min >60  GFR, Est African American     >60 mL/min >60  Anion gap     5 - 15 12    Review of Systems  Constitutional: Negative for activity change, appetite change, fatigue and fever.  HENT: Negative for mouth sores, nosebleeds and sore throat.   Eyes: Positive for photophobia. Negative for redness.  Respiratory: Negative for cough and wheezing.   Gastrointestinal: Negative for blood in stool.       Negative for changes in bowel habits.  Musculoskeletal: Negative for back pain, gait problem and myalgias.  Neurological: Negative for syncope, facial asymmetry, weakness and numbness.  Psychiatric/Behavioral: Negative for confusion. The patient is nervous/anxious.   Rest of ROS, see pertinent positives sand negatives in HPI  Current Outpatient Medications on File Prior to Visit  Medication Sig Dispense Refill  . atorvastatin (LIPITOR) 40 MG tablet Take 40 mg by mouth daily.    . rivaroxaban (XARELTO) 20 MG TABS tablet Take 1 tablet (20 mg total) by mouth daily with supper. 90 tablet 0  . topiramate (TOPAMAX) 100 MG tablet Take 100 mg by mouth daily.     No current  facility-administered medications on file prior to visit.   Past Medical History:  Diagnosis Date  . Depression   . Hyperlipidemia   . Migraine    No Known Allergies  Social History   Socioeconomic History  . Marital status: Married    Spouse name: Not on file  . Number of children: Not on file  . Years of education: Not on file  . Highest education level: Not on file  Occupational History  . Not on file  Tobacco Use  . Smoking status: Former Research scientist (life sciences)  . Smokeless tobacco: Never Used  Substance and Sexual Activity  . Alcohol use: No  . Drug use: Not on file  . Sexual activity: Not on file  Other Topics Concern  . Not on file    Social History Narrative   ** Merged History Encounter **       Social Determinants of Health   Financial Resource Strain:   . Difficulty of Paying Living Expenses:   Food Insecurity:   . Worried About Charity fundraiser in the Last Year:   . Arboriculturist in the Last Year:   Transportation Needs:   . Film/video editor (Medical):   Marland Kitchen Lack of Transportation (Non-Medical):   Physical Activity:   . Days of Exercise per Week:   . Minutes of Exercise per Session:   Stress:   . Feeling of Stress :   Social Connections:   . Frequency of Communication with Friends and Family:   . Frequency of Social Gatherings with Friends and Family:   . Attends Religious Services:   . Active Member of Clubs or Organizations:   . Attends Archivist Meetings:   Marland Kitchen Marital Status:     Vitals:   10/20/19 0828  BP: 118/70  Pulse: 84  Resp: 12  Temp: 97.9 F (36.6 C)  SpO2: 99%   Body mass index is 33.11 kg/m.   Physical Exam  Nursing note and vitals reviewed. Constitutional: She is oriented to person, place, and time. She appears well-developed. No distress.  HENT:  Head: Normocephalic and atraumatic.  Mouth/Throat: Oropharynx is clear and moist and mucous membranes are normal.  Eyes: Pupils are equal, round, and reactive to light. Conjunctivae are normal.  Cardiovascular: Normal rate and regular rhythm.  No murmur heard. Pulses:      Dorsalis pedis pulses are 2+ on the right side and 2+ on the left side.  Respiratory: Effort normal and breath sounds normal. No respiratory distress.  GI: Soft. She exhibits no mass. There is no hepatomegaly. There is no abdominal tenderness.  Musculoskeletal:        General: No edema.  Lymphadenopathy:    She has no cervical adenopathy.  Neurological: She is alert and oriented to person, place, and time. She has normal strength. No cranial nerve deficit. Gait normal.  Skin: Skin is warm. No rash noted. No erythema.  Psychiatric: Her  mood appears anxious.  Well groomed, good eye contact.   ASSESSMENT AND PLAN:  Jeanne Payne was seen today for 3-4 months follow-up.  Orders Placed This Encounter  Procedures  . Comprehensive metabolic panel  . Lipid panel  . CBC   Lab Results  Component Value Date   WBC 7.5 10/20/2019   HGB 14.0 10/20/2019   HCT 41.6 10/20/2019   MCV 88.1 10/20/2019   PLT 283.0 10/20/2019   Lab Results  Component Value Date   CHOL 211 (H) 10/20/2019   HDL  44.20 10/20/2019   LDLCALC 133 (H) 10/20/2019   TRIG 170.0 (H) 10/20/2019   CHOLHDL 5 10/20/2019   Lab Results  Component Value Date   ALT 18 10/20/2019   AST 20 10/20/2019   ALKPHOS 50 10/20/2019   BILITOT 0.2 10/20/2019   Lab Results  Component Value Date   CREATININE 0.85 10/20/2019   BUN 12 10/20/2019   NA 139 10/20/2019   K 4.4 10/20/2019   CL 105 10/20/2019   CO2 26 10/20/2019    1. Migraine with aura and without status migrainosus, not intractable Problem is stable. No changes in Topamax or Imitrex. Stop Phenergan and try Zofran instead.  - SUMAtriptan (IMITREX) 100 MG tablet; Take 1 tablet (100 mg total) by mouth daily as needed for migraine.  Dispense: 10 tablet; Refill: 3 - CBC - ondansetron (ZOFRAN) 4 MG tablet; Take 1 tablet (4 mg total) by mouth daily as needed for nausea or vomiting (migraine).  Dispense: 15 tablet; Refill: 0  2. Hyperlipidemia, unspecified hyperlipidemia type Continue Atorvastatin 40 mg daily.  Further recommendations according to lab results.  The 10-year ASCVD risk score Mikey Bussing DC Brooke Bonito., et al., 2013) is: 1.3%   Values used to calculate the score:     Age: 58 years     Sex: Female     Is Non-Hispanic African American: No     Diabetic: No     Tobacco smoker: No     Systolic Blood Pressure: 123456 mmHg     Is BP treated: No     HDL Cholesterol: 44.2 mg/dL     Total Cholesterol: 211 mg/dL  3. Hypokalemia K+ rich diet for now. Further recommendations according to K+  results.  4. Gastroesophageal reflux disease, unspecified whether esophagitis present GERD precautions recommended. Omeprazole and Protonix did not help. Nexium 40 mg 30 min before breakfast.  - esomeprazole (NEXIUM) 40 MG capsule; Take 1 capsule (40 mg total) by mouth daily.  Dispense: 30 capsule; Refill: 3  5. Anxiety disorder, unspecified type Not well controlled. Resume Lexapro 20 mg daily,she can start with 1/2 tab for a few days and increased dose if well  - escitalopram (LEXAPRO) 20 MG tablet; Take 1 tablet (20 mg total) by mouth daily.  Dispense: 90 tablet; Refill: 1  6. Pain in both lower extremities Stable. ? Vein insufficiency. Recommend wearing compression stocking.  Monitor for new symptoms.   Return in about 3 months (around 01/19/2020).    Raylee Strehl G. Martinique, MD  [facility]. [place] office.

## 2019-10-22 ENCOUNTER — Encounter: Payer: Self-pay | Admitting: Family Medicine

## 2019-10-22 MED ORDER — ONDANSETRON HCL 4 MG PO TABS: 4.0000 mg | ORAL_TABLET | Freq: Every day | ORAL | 0 refills | Status: DC | PRN

## 2019-12-14 ENCOUNTER — Other Ambulatory Visit: Payer: Self-pay | Admitting: Family Medicine

## 2019-12-14 ENCOUNTER — Telehealth: Payer: Self-pay | Admitting: Family Medicine

## 2019-12-14 NOTE — Telephone Encounter (Signed)
Pt need a refill on medication  topiramate (TOPAMAX) 100 MG tablet  [id number] - [address], [place] AT [address]  Phone:  [phone] Fax:  [phone]

## 2019-12-14 NOTE — Telephone Encounter (Signed)
Message sent to PCP for review and approval.

## 2019-12-18 NOTE — Telephone Encounter (Signed)
Rx was sent. ?Thanks, ?BJ ?

## 2020-01-19 ENCOUNTER — Ambulatory Visit: Payer: Managed Care, Other (non HMO) | Admitting: Family Medicine

## 2020-01-25 ENCOUNTER — Encounter: Payer: Self-pay | Admitting: Family Medicine

## 2020-01-25 ENCOUNTER — Ambulatory Visit: Payer: Managed Care, Other (non HMO) | Admitting: Family Medicine

## 2020-01-25 ENCOUNTER — Other Ambulatory Visit: Payer: Self-pay

## 2020-01-25 VITALS — BP 126/70 | HR 95 | Temp 97.7°F | Resp 16 | Ht 66.0 in | Wt 205.5 lb

## 2020-01-25 DIAGNOSIS — E66811 Obesity, class 1: Secondary | ICD-10-CM | POA: Insufficient documentation

## 2020-01-25 DIAGNOSIS — N938 Other specified abnormal uterine and vaginal bleeding: Secondary | ICD-10-CM

## 2020-01-25 DIAGNOSIS — K219 Gastro-esophageal reflux disease without esophagitis: Secondary | ICD-10-CM | POA: Diagnosis not present

## 2020-01-25 DIAGNOSIS — N644 Mastodynia: Secondary | ICD-10-CM

## 2020-01-25 DIAGNOSIS — Z6833 Body mass index (BMI) 33.0-33.9, adult: Secondary | ICD-10-CM | POA: Insufficient documentation

## 2020-01-25 DIAGNOSIS — R5382 Chronic fatigue, unspecified: Secondary | ICD-10-CM | POA: Diagnosis not present

## 2020-01-25 DIAGNOSIS — E669 Obesity, unspecified: Secondary | ICD-10-CM

## 2020-01-25 NOTE — Assessment & Plan Note (Signed)
Seems to be worse for the past 1 to 2 months. She has had blood work in the past few months, I do not think we need further work up today. ? Hormonal related, premenstrual synd. OSA to be considered.

## 2020-01-25 NOTE — Patient Instructions (Addendum)
A few things to remember from today's visit:  No changes in Nexium. Pepcid 20 mg at bedtime as needed. Continue GERD [recautions. Diagnostic mammogram will be arranged as well as appt with gyn.   If you need refills please call your pharmacy. Do not use My Chart to request refills or for acute issues that need immediate attention.    Please be sure medication list is accurate. If a new problem present, please set up appointment sooner than planned today.

## 2020-01-25 NOTE — Progress Notes (Signed)
HPI: Jeanne Payne is a 49 y.o. female, who is here today for 3 months follow up.   She was last seen on 10/20/2019.  No new problems since her last visit.  GERD: Last visit she was started on Nexium 40 mg daily. She has tried omeprazole and Protonix in the past. Medication has helped greatly. For the past few weeks she has had "little" acid reflux occasionally.  Anxiety: Last visit Lexapro was resumed. She is on Lexapro 20 mg daily. Symptoms have improved. Negative for depression. Or suicidal thoughts  Lower abdominal cramps for the past few days. Negative for nausea, vomiting, or changes in bowel habits. No urinary symptoms. Similar symptoms around menstrual periods before endometrial ablation. Vaginal bleeding noted today. Endometrial ablation about 13 years ago.  Today she is complaining about worsening fatigue for the past 1 to 2 months. Hx of fatigue,was worse around menses.  She does not feel rested in the morning, Sometimes she wakes up herself up due to snoring.  Lab Results  Component Value Date   WBC 7.5 10/20/2019   HGB 14.0 10/20/2019   HCT 41.6 10/20/2019   MCV 88.1 10/20/2019   PLT 283.0 10/20/2019   Lab Results  Component Value Date   TSH 1.807 07/21/2019   Lab Results  Component Value Date   CREATININE 0.85 10/20/2019   BUN 12 10/20/2019   NA 139 10/20/2019   K 4.4 10/20/2019   CL 105 10/20/2019   CO2 26 10/20/2019   Complaining about weight gain and breast tenderness. Shooting like pain that last seconds. She has not noted nipple discharge or skin changes. No hx of trauma.  She has not changed dietary habits and not exercising regularly.  Review of Systems  Constitutional: Negative for activity change, appetite change and fever.  HENT: Negative for mouth sores, nosebleeds and sore throat.   Eyes: Negative for redness and visual disturbance.  Respiratory: Negative for cough, shortness of breath and wheezing.     Cardiovascular: Negative for chest pain, palpitations and leg swelling.  Endocrine: Negative for cold intolerance and heat intolerance.  Genitourinary: Negative for decreased urine volume and hematuria.  Musculoskeletal: Negative for gait problem and joint swelling.  Neurological: Positive for headaches (A couple per months). Negative for syncope and weakness.  Psychiatric/Behavioral: Negative for confusion. The patient is nervous/anxious.   Rest of ROS, see pertinent positives sand negatives in HPI  Current Outpatient Medications on File Prior to Visit  Medication Sig Dispense Refill  . atorvastatin (LIPITOR) 40 MG tablet Take 40 mg by mouth daily.    Marland Kitchen escitalopram (LEXAPRO) 20 MG tablet Take 1 tablet (20 mg total) by mouth daily. 90 tablet 1  . esomeprazole (NEXIUM) 40 MG capsule Take 1 capsule (40 mg total) by mouth daily. 30 capsule 3  . ondansetron (ZOFRAN) 4 MG tablet Take 1 tablet (4 mg total) by mouth daily as needed for nausea or vomiting (migraine). 15 tablet 0  . rivaroxaban (XARELTO) 20 MG TABS tablet Take 1 tablet (20 mg total) by mouth daily with supper. 90 tablet 0  . SUMAtriptan (IMITREX) 100 MG tablet Take 1 tablet (100 mg total) by mouth daily as needed for migraine. 10 tablet 3  . topiramate (TOPAMAX) 100 MG tablet TAKE 1 TABLET BY MOUTH AT BEDTIME 30 tablet 1   No current facility-administered medications on file prior to visit.    Past Medical History:  Diagnosis Date  . Depression   . Hyperlipidemia   .  Migraine    No Known Allergies  Social History   Socioeconomic History  . Marital status: Married    Spouse name: Not on file  . Number of children: Not on file  . Years of education: Not on file  . Highest education level: Not on file  Occupational History  . Not on file  Tobacco Use  . Smoking status: Former Research scientist (life sciences)  . Smokeless tobacco: Never Used  Substance and Sexual Activity  . Alcohol use: No  . Drug use: Not on file  . Sexual activity: Not  on file  Other Topics Concern  . Not on file  Social History Narrative   ** Merged History Encounter **       Social Determinants of Health   Financial Resource Strain:   . Difficulty of Paying Living Expenses:   Food Insecurity:   . Worried About Charity fundraiser in the Last Year:   . Arboriculturist in the Last Year:   Transportation Needs:   . Film/video editor (Medical):   Marland Kitchen Lack of Transportation (Non-Medical):   Physical Activity:   . Days of Exercise per Week:   . Minutes of Exercise per Session:   Stress:   . Feeling of Stress :   Social Connections:   . Frequency of Communication with Friends and Family:   . Frequency of Social Gatherings with Friends and Family:   . Attends Religious Services:   . Active Member of Clubs or Organizations:   . Attends Archivist Meetings:   Marland Kitchen Marital Status:     Vitals:   01/25/20 1150  BP: 126/70  Pulse: 95  Resp: 16  Temp: 97.7 F (36.5 C)  SpO2: 98%   Wt Readings from Last 3 Encounters:  01/25/20 205 lb 8 oz (93.2 kg)  10/20/19 205 lb 2 oz (93 kg)  07/22/19 202 lb (91.6 kg)   Body mass index is 33.17 kg/m.  Physical Exam Vitals and nursing note reviewed.  Constitutional:      General: She is not in acute distress.    Appearance: She is well-developed.  HENT:     Head: Normocephalic and atraumatic.     Mouth/Throat:     Mouth: Mucous membranes are moist.     Pharynx: Oropharynx is clear.  Eyes:     Conjunctiva/sclera: Conjunctivae normal.     Pupils: Pupils are equal, round, and reactive to light.  Cardiovascular:     Rate and Rhythm: Normal rate and regular rhythm.     Heart sounds: No murmur heard.   Pulmonary:     Effort: Pulmonary effort is normal. No respiratory distress.     Breath sounds: Normal breath sounds.  Abdominal:     Palpations: Abdomen is soft. There is no hepatomegaly or mass.     Tenderness: There is no abdominal tenderness.  Genitourinary:    Comments: Breast:  Negative for masses, nipple discharge, or skin changes. Left axillary tenderness with palpation. Lymphadenopathy:     Cervical: No cervical adenopathy.     Upper Body:     Right upper body: No axillary adenopathy.     Left upper body: No axillary adenopathy.  Skin:    General: Skin is warm.     Findings: No erythema or rash.  Neurological:     Mental Status: She is alert and oriented to person, place, and time.     Cranial Nerves: No cranial nerve deficit.     Gait: Gait  normal.  Psychiatric:     Comments: Well groomed, good eye contact.    ASSESSMENT AND PLAN:  Ms. Aneita Kiger was seen today for 4 months follow-up.  Orders Placed This Encounter  Procedures  . MM DIAG BREAST TOMO BILATERAL  . Ambulatory referral to Gynecology   DUB (dysfunctional uterine bleeding) We discussed possible etiologies. S/P BTL. Because age endometrial bx and pelvic/transvaginal US are going to be necessary. Gyn appt will be arranged.  Breast tenderness in female Could be hormonal related. On exam left axillary tenderness. Dx mammogram will be arranged.  Chronic fatigue Seems to be worse for the past 1 to 2 months. She has had blood work in the past few months, I do not think we need further work up today. ? Hormonal related, premenstrual synd. OSA to be considered.   GERD (gastroesophageal reflux disease) Reporting great improvement but still having occasional acid reflux. Continue Nexium 40 mg daily. GERD precautions. She can add Pepcid 20 mg at bedtime as needed.  Class 1 obesity with body mass index (BMI) of 33.0 to 33.9 in adult Weight has been stable. Recommend counting calories and engaging in regular physical activity. Daily walking for 10 to 15 minutes. Weight watchers is a good option.   Return in about 6 months (around 07/27/2020) for cpe.   Kincaid Tiger G. Martin[place], MD  [facility]. [place] office.  A few things to remember from today's  visit:  No changes in Nexium. Pepcid 20 mg at bedtime as needed. Continue GERD [recautions. Diagnostic mammogram will be arranged as well as appt with gyn.   If you need refills please call your pharmacy. Do not use My Chart to request refills or for acute issues that need immediate attention.    Please be sure medication list is accurate. If a new problem present, please set up appointment sooner than planned today.

## 2020-01-25 NOTE — Assessment & Plan Note (Signed)
Reporting great improvement but still having occasional acid reflux. Continue Nexium 40 mg daily. GERD precautions. She can add Pepcid 20 mg at bedtime as needed.

## 2020-01-25 NOTE — Assessment & Plan Note (Signed)
Weight has been stable. Recommend counting calories and engaging in regular physical activity. Daily walking for 10 to 15 minutes. Weight watchers is a good option.

## 2020-02-01 ENCOUNTER — Other Ambulatory Visit: Payer: Self-pay | Admitting: Family Medicine

## 2020-02-01 DIAGNOSIS — N644 Mastodynia: Secondary | ICD-10-CM

## 2020-02-09 ENCOUNTER — Other Ambulatory Visit: Payer: Self-pay

## 2020-02-09 ENCOUNTER — Encounter: Payer: Self-pay | Admitting: Nurse Practitioner

## 2020-02-09 ENCOUNTER — Ambulatory Visit: Payer: Managed Care, Other (non HMO) | Admitting: Nurse Practitioner

## 2020-02-09 VITALS — BP 120/80 | Ht 66.0 in | Wt 205.0 lb

## 2020-02-09 DIAGNOSIS — N898 Other specified noninflammatory disorders of vagina: Secondary | ICD-10-CM

## 2020-02-09 DIAGNOSIS — N760 Acute vaginitis: Secondary | ICD-10-CM | POA: Diagnosis not present

## 2020-02-09 DIAGNOSIS — N938 Other specified abnormal uterine and vaginal bleeding: Secondary | ICD-10-CM

## 2020-02-09 DIAGNOSIS — B9689 Other specified bacterial agents as the cause of diseases classified elsewhere: Secondary | ICD-10-CM

## 2020-02-09 DIAGNOSIS — R1030 Lower abdominal pain, unspecified: Secondary | ICD-10-CM | POA: Diagnosis not present

## 2020-02-09 LAB — WET PREP FOR TRICH, YEAST, CLUE

## 2020-02-09 MED ORDER — TINIDAZOLE 500 MG PO TABS: 500.0000 mg | ORAL_TABLET | Freq: Two times a day (BID) | ORAL | 0 refills | Status: AC

## 2020-02-09 NOTE — Progress Notes (Signed)
   Jeanne Payne July 22, 1970 [id number]   History:  49 y.o. G3P0003 presents as new patient to establish care. Was seen by primary care with complaints of vaginal bleeding and breast tenderness in left axillary region. Diagnostic mammogam scheduled for 02/17/2020. Normal mammogram history. Endometrial ablation 2008, no bleeding since until about 2 weeks ago. The bleeding is light and intermittent. Prior to bleeding she had cramping and mood changes. BTL. Has had a 60 pound weight gain in the last few years. She denies menopausal symptoms.   Gynecologic History Patient's last menstrual period was 01/25/2020.   Contraception: tubal ligation Last Pap: 2018. Results were: normal Last mammogram: 2019. Results were: normal   Past medical history, past surgical history, family history and social history were all reviewed and documented in the EPIC chart.  ROS:  A ROS was performed and pertinent positives and negatives are included.  Exam:  Vitals:   02/09/20 1203  Weight: 205 lb (93 kg)  Height: 5\' 6"  (1.676 m)   Body mass index is 33.09 kg/m.  General appearance:  Normal Abdominal  Soft,nontender, without masses, guarding or rebound.  Liver/spleen:  No organomegaly noted  Hernia:  None appreciated  Skin  Inspection:  Grossly normal Gentitourinary   Inguinal/mons:  Normal without inguinal adenopathy  External genitalia:  Normal  BUS/Urethra/Skene's glands:  Normal  Vagina:  Thick, white discharge. Mild erythema  Cervix:  Normal  Uterus:  Anteverted, normal in size, shape and contour.  Midline and mobile  Adnexa/parametria:     Rt: Without masses or tenderness.   Lt: Without masses or tenderness.  Anus and perineum: Normal  Wet prep + clue cells  Assessment/Plan:  49 y.o. G3P0003 as new patient for vaginal bleeding.  DUB (dysfunctional uterine bleeding) - Plan: US PELVIS TRANSVAGINAL NON-OB (TV ONLY), Follicle stimulating hormone.   Lower abdominal pain - Plan:  US PELVIS TRANSVAGINAL NON-OB (TV ONLY)  Vaginal discharge - Plan: WET PREP FOR TRICH, YEAST, CLUE  Bacterial vaginosis - Plan: tinidazole (TINDAMAX) 500 MG tablet twice a day for 2 days. Confirmed by wet prep.   Follow up as needed        [place], 12:05 PM 02/09/2020

## 2020-02-09 NOTE — Patient Instructions (Addendum)
Dysfunctional Uterine Bleeding Dysfunctional uterine bleeding is abnormal bleeding from the uterus. Dysfunctional uterine bleeding includes:  A menstrual period that comes earlier or later than usual.  A menstrual period that is lighter or heavier than usual, or has large blood clots.  Vaginal bleeding between menstrual periods.  Skipping one or more menstrual periods.  Vaginal bleeding after sex.  Vaginal bleeding after menopause. Follow these instructions at home: Eating and drinking   Eat well-balanced meals. Include foods that are high in iron, such as liver, meat, shellfish, green leafy vegetables, and eggs.  To prevent or treat constipation, your health care provider may recommend that you: ? Drink enough fluid to keep your urine pale yellow. ? Take over-the-counter or prescription medicines. ? Eat foods that are high in fiber, such as beans, whole grains, and fresh fruits and vegetables. ? Limit foods that are high in fat and processed sugars, such as fried or sweet foods. Medicines  Take over-the-counter and prescription medicines only as told by your health care provider.  Do not change medicines without talking with your health care provider.  Aspirin or medicines that contain aspirin may make the bleeding worse. Do not take those medicines: ? During the week before your menstrual period. ? During your menstrual period.  If you were prescribed iron pills, take them as told by your health care provider. Iron pills help to replace iron that your body loses because of this condition. Activity  If you need to change your sanitary pad or tampon more than one time every 2 hours: ? Lie in bed with your feet raised (elevated). ? Place a cold pack on your lower abdomen. ? Rest as much as possible until the bleeding stops or slows down.  Do not try to lose weight until the bleeding has stopped and your blood iron level is back to normal. General instructions   For two  months, write down: ? When your menstrual period starts. ? When your menstrual period ends. ? When any abnormal vaginal bleeding occurs. ? What problems you notice.  Keep all follow up visits as told by your health care provider. This is important. Contact a health care provider if you:  Feel light-headed or weak.  Have nausea and vomiting.  Cannot eat or drink without vomiting.  Feel dizzy or have diarrhea while you are taking medicines.  Are taking birth control pills or hormones, and you want to change them or stop taking them. Get help right away if:  You develop a fever or chills.  You need to change your sanitary pad or tampon more than one time per hour.  Your vaginal bleeding becomes heavier, or your flow contains clots more often.  You develop pain in your abdomen.  You lose consciousness.  You develop a rash. Summary  Dysfunctional uterine bleeding is abnormal bleeding from the uterus.  It includes menstrual bleeding of abnormal duration, volume, or regularity.  Bleeding after sex and after menopause are also considered dysfunctional uterine bleeding. This information is not intended to replace advice given to you by your health care provider. Make sure you discuss any questions you have with your health care provider. Document Revised: 11/20/2017 Document Reviewed: 11/20/2017 Elsevier Patient Education  [place].  Bacterial Vaginosis  Bacterial vaginosis is a vaginal infection that occurs when the normal balance of bacteria in the vagina is disrupted. It results from an overgrowth of certain bacteria. This is the most common vaginal infection among women ages 55-44. Because bacterial  vaginosis increases your risk for STIs (sexually transmitted infections), getting treated can help reduce your risk for chlamydia, gonorrhea, herpes, and HIV (human immunodeficiency virus). Treatment is also important for preventing complications in pregnant women,  because this condition can cause an early (premature) delivery. What are the causes? This condition is caused by an increase in harmful bacteria that are normally present in small amounts in the vagina. However, the reason that the condition develops is not fully understood. What increases the risk? The following factors may make you more likely to develop this condition:  Having a new sexual partner or multiple sexual partners.  Having unprotected sex.  Douching.  Having an intrauterine device (IUD).  Smoking.  Drug and alcohol abuse.  Taking certain antibiotic medicines.  Being pregnant. You cannot get bacterial vaginosis from toilet seats, bedding, swimming pools, or contact with objects around you. What are the signs or symptoms? Symptoms of this condition include:  Grey or white vaginal discharge. The discharge can also be watery or foamy.  A fish-like odor with discharge, especially after sexual intercourse or during menstruation.  Itching in and around the vagina.  Burning or pain with urination. Some women with bacterial vaginosis have no signs or symptoms. How is this diagnosed? This condition is diagnosed based on:  Your medical history.  A physical exam of the vagina.  Testing a sample of vaginal fluid under a microscope to look for a large amount of bad bacteria or abnormal cells. Your health care provider may use a cotton swab or a small wooden spatula to collect the sample. How is this treated? This condition is treated with antibiotics. These may be given as a pill, a vaginal cream, or a medicine that is put into the vagina (suppository). If the condition comes back after treatment, a second round of antibiotics may be needed. Follow these instructions at home: Medicines  Take over-the-counter and prescription medicines only as told by your health care provider.  Take or use your antibiotic as told by your health care provider. Do not stop taking or  using the antibiotic even if you start to feel better. General instructions  If you have a female sexual partner, tell her that you have a vaginal infection. She should see her health care provider and be treated if she has symptoms. If you have a female sexual partner, he does not need treatment.  During treatment: ? Avoid sexual activity until you finish treatment. ? Do not douche. ? Avoid alcohol as directed by your health care provider. ? Avoid breastfeeding as directed by your health care provider.  Drink enough water and fluids to keep your urine clear or pale yellow.  Keep the area around your vagina and rectum clean. ? Wash the area daily with warm water. ? Wipe yourself from front to back after using the toilet.  Keep all follow-up visits as told by your health care provider. This is important. How is this prevented?  Do not douche.  Wash the outside of your vagina with warm water only.  Use protection when having sex. This includes latex condoms and dental dams.  Limit how many sexual partners you have. To help prevent bacterial vaginosis, it is best to have sex with just one partner (monogamous).  Make sure you and your sexual partner are tested for STIs.  Wear cotton or cotton-lined underwear.  Avoid wearing tight pants and pantyhose, especially during summer.  Limit the amount of alcohol that you drink.  Do not use  any products that contain nicotine or tobacco, such as cigarettes and e-cigarettes. If you need help quitting, ask your health care provider.  Do not use illegal drugs. Where to find more information  Centers for Disease Control and Prevention: AppraiserFraud.fi  American Sexual Health Association (ASHA): www.ashastd.org  [place] Department of Health and Financial controller, Office on Women's Health: DustingSprays.pl or SecuritiesCard.it Contact a health care provider if:  Your symptoms do not improve, even  after treatment.  You have more discharge or pain when urinating.  You have a fever.  You have pain in your abdomen.  You have pain during sex.  You have vaginal bleeding between periods. Summary  Bacterial vaginosis is a vaginal infection that occurs when the normal balance of bacteria in the vagina is disrupted.  Because bacterial vaginosis increases your risk for STIs (sexually transmitted infections), getting treated can help reduce your risk for chlamydia, gonorrhea, herpes, and HIV (human immunodeficiency virus). Treatment is also important for preventing complications in pregnant women, because the condition can cause an early (premature) delivery.  This condition is treated with antibiotic medicines. These may be given as a pill, a vaginal cream, or a medicine that is put into the vagina (suppository). This information is not intended to replace advice given to you by your health care provider. Make sure you discuss any questions you have with your health care provider. Document Revised: 05/24/2017 Document Reviewed: 02/25/2016 Elsevier Patient Education  2020 [employer].

## 2020-02-10 LAB — FOLLICLE STIMULATING HORMONE: FSH: 5.3 m[IU]/mL

## 2020-02-11 ENCOUNTER — Ambulatory Visit: Payer: Managed Care, Other (non HMO) | Admitting: Nurse Practitioner

## 2020-02-11 ENCOUNTER — Ambulatory Visit (INDEPENDENT_AMBULATORY_CARE_PROVIDER_SITE_OTHER): Payer: Managed Care, Other (non HMO)

## 2020-02-11 ENCOUNTER — Other Ambulatory Visit: Payer: Self-pay

## 2020-02-11 ENCOUNTER — Encounter: Payer: Self-pay | Admitting: Nurse Practitioner

## 2020-02-11 VITALS — BP 122/80

## 2020-02-11 DIAGNOSIS — N938 Other specified abnormal uterine and vaginal bleeding: Secondary | ICD-10-CM | POA: Diagnosis not present

## 2020-02-11 DIAGNOSIS — N939 Abnormal uterine and vaginal bleeding, unspecified: Secondary | ICD-10-CM

## 2020-02-11 DIAGNOSIS — N854 Malposition of uterus: Secondary | ICD-10-CM

## 2020-02-11 DIAGNOSIS — Z9889 Other specified postprocedural states: Secondary | ICD-10-CM | POA: Diagnosis not present

## 2020-02-11 DIAGNOSIS — D259 Leiomyoma of uterus, unspecified: Secondary | ICD-10-CM

## 2020-02-11 DIAGNOSIS — N838 Other noninflammatory disorders of ovary, fallopian tube and broad ligament: Secondary | ICD-10-CM | POA: Diagnosis not present

## 2020-02-11 DIAGNOSIS — R1030 Lower abdominal pain, unspecified: Secondary | ICD-10-CM

## 2020-02-11 NOTE — Progress Notes (Signed)
History: 49 year old G3P3 presents to discuss ultrasound.  Was seen by me 02/09/2020 for light, intermittent vaginal bleeding.  2008 endometrial ablation, had no bleeding since until about 2 weeks ago.  Complains of cramping and mood changes prior to bleeding.  BTL.  Denies menopausal symptoms.  [facility] 02/09/2020 was 5.3.  Exam: Appears well Ultrasound: Anteverted uterus, normal size and shape, myometrium with cystic spaces right and left of endometrium at fundus from ablation.  Fibroids measuring 15 mm and 5 mm.  Irregular undefined endometrium due to ablation, no masses or thickening seen.  Both ovaries normal size with normal follicle pattern, left adnexal with paratubal cyst -10 x 7 mm, avascular, echo-free, thin walls.  No adnexal masses, no free fluid.  Assessment:  Abnormal uterine bleeding History of endometrial ablation 2008 Late-onset endometrial ablation failure  Plan: Discussed ultrasound and reassurance provided on normal findings. Bleeding most likely result of late-onset endometrial ablation failure. We discussed treatment with hormonal contraception to help regulate bleeding if needed. She is only spotting intermittently now so we will not do anything at this time. She is agreeable to plan.

## 2020-02-11 NOTE — Patient Instructions (Signed)

## 2020-02-15 ENCOUNTER — Ambulatory Visit: Payer: Self-pay | Admitting: Obstetrics and Gynecology

## 2020-02-17 ENCOUNTER — Other Ambulatory Visit: Payer: Self-pay | Admitting: Family Medicine

## 2020-02-17 ENCOUNTER — Other Ambulatory Visit: Payer: Managed Care, Other (non HMO)

## 2020-02-17 ENCOUNTER — Ambulatory Visit
Admission: RE | Admit: 2020-02-17 | Discharge: 2020-02-17 | Disposition: A | Discharge status: Home / Self Care | Payer: Managed Care, Other (non HMO) | Source: Ambulatory Visit | Attending: Family Medicine | Admitting: Family Medicine

## 2020-02-17 ENCOUNTER — Other Ambulatory Visit: Payer: Self-pay

## 2020-02-17 ENCOUNTER — Ambulatory Visit: Payer: Managed Care, Other (non HMO)

## 2020-02-17 DIAGNOSIS — N644 Mastodynia: Secondary | ICD-10-CM

## 2020-02-18 ENCOUNTER — Other Ambulatory Visit: Payer: Self-pay | Admitting: Family Medicine

## 2020-03-07 ENCOUNTER — Encounter: Payer: Self-pay | Admitting: Family Medicine

## 2020-03-08 ENCOUNTER — Encounter: Payer: Self-pay | Admitting: Physician Assistant

## 2020-03-14 ENCOUNTER — Encounter: Payer: Self-pay | Admitting: Family Medicine

## 2020-03-14 ENCOUNTER — Other Ambulatory Visit: Payer: Self-pay

## 2020-03-14 ENCOUNTER — Ambulatory Visit (INDEPENDENT_AMBULATORY_CARE_PROVIDER_SITE_OTHER)
Admission: RE | Admit: 2020-03-14 | Discharge: 2020-03-14 | Disposition: A | Discharge status: Home / Self Care | Payer: Managed Care, Other (non HMO) | Source: Ambulatory Visit | Attending: Family Medicine | Admitting: Family Medicine

## 2020-03-14 ENCOUNTER — Ambulatory Visit: Payer: Managed Care, Other (non HMO) | Admitting: Family Medicine

## 2020-03-14 VITALS — BP 120/78 | HR 94 | Temp 98.4°F | Resp 16 | Ht 66.0 in | Wt 204.5 lb

## 2020-03-14 DIAGNOSIS — R0602 Shortness of breath: Secondary | ICD-10-CM

## 2020-03-14 DIAGNOSIS — R200 Anesthesia of skin: Secondary | ICD-10-CM

## 2020-03-14 DIAGNOSIS — G5601 Carpal tunnel syndrome, right upper limb: Secondary | ICD-10-CM

## 2020-03-14 DIAGNOSIS — M791 Myalgia, unspecified site: Secondary | ICD-10-CM

## 2020-03-14 DIAGNOSIS — K219 Gastro-esophageal reflux disease without esophagitis: Secondary | ICD-10-CM

## 2020-03-14 DIAGNOSIS — M549 Dorsalgia, unspecified: Secondary | ICD-10-CM

## 2020-03-14 DIAGNOSIS — R209 Unspecified disturbances of skin sensation: Secondary | ICD-10-CM

## 2020-03-14 NOTE — Progress Notes (Signed)
Chief Complaint  Patient presents with  . Numbness  . coldness   HPI: Jeanne Payne is a 49 y.o. female, who is here today with above complaints. Problem has been going on for a few weeks Feet feel cold "all the time", this problem has been going on "forever." Sometimes she has some relief with placing feet on the lightheaded.  Toes "always" purple. Sometimes pink or white mainly on plantar aspect. Symptoms have been worse for the past 2 weeks. She has not noted any skin ulcers. No limitation of movement but she can not feel the movement.  Bilateral LE pain.she has not identified exacerbating or alleviating factors. It is "random." Sometimes interferes with sleep. "Pulsatil" like pain, "deep sore" pain like she had a bruise. She has had lower extremity pain,L>R, for a few months. Former PCP ordered a LE vein [place] that showed RLE proximal DVT. She completed treatment with Xarelto.  For the past 2-3 weeks she has felt like her feet are wet or like she is walking on a wet carpet. LE's numbness,, mainly in toes. Intermittently for the past 2 weeks and getting worse. Right hand numbness and arms achy pain that she noted after starting Xarelto. Left ankle problems for a while, felt better when she was on Xarelto.  -She has had some upper back pain and occasionally right rib cage pain radiated to upper back. It feels like "gas."  Left-sided intermittent neck pain intermittent for about 2 months, getting worse. No injuries. Lab Results  Component Value Date   TSH 1.807 07/21/2019   Lab Results  Component Value Date   ALT 18 10/20/2019   AST 20 10/20/2019   ALKPHOS 50 10/20/2019   BILITOT 0.2 10/20/2019   Lab Results  Component Value Date   CREATININE 0.85 10/20/2019   BUN 12 10/20/2019   NA 139 10/20/2019   K 4.4 10/20/2019   CL 105 10/20/2019   CO2 26 10/20/2019   Feels SOB, worse with the mask on. SOB for a while but seems to be getting worse for  the past 2 to 3 weeks. It is exacerbated by minimal activity, even with bending over. No associated CP,cough, or wheezing.  Chest CT 07/21/19: 1. No demonstrable pulmonary embolus. No thoracic aortic aneurysm or dissection. 2.  No edema or airspace opacity.  No pleural effusions. 3.  Small hiatal hernia. 4.  No evident adenopathy.  No fever,chills,sore throat, or diaphoresis.  GERD: Problem has improved. Nexium still works well, last night she had heartburn after eating a piece of chocolate.  Review of Systems  Constitutional: Positive for fatigue. Negative for activity change and appetite change.  HENT: Negative for mouth sores, nosebleeds and trouble swallowing.   Eyes: Negative for redness and visual disturbance.  Gastrointestinal: Negative for abdominal pain, nausea and vomiting.       Negative for changes in bowel habits.  Genitourinary: Negative for decreased urine volume, dysuria and hematuria.  Musculoskeletal: Positive for arthralgias and myalgias. Negative for gait problem.  Neurological: Positive for headaches (Hx of migraine,no more than usual). Negative for syncope, facial asymmetry and weakness.  Psychiatric/Behavioral: Positive for sleep disturbance. Negative for confusion. The patient is nervous/anxious.   Rest see pertinent positives and negatives per HPI.  Current Outpatient Medications on File Prior to Visit  Medication Sig Dispense Refill  . atorvastatin (LIPITOR) 40 MG tablet Take 40 mg by mouth daily.    Marland Kitchen escitalopram (LEXAPRO) 20 MG tablet Take 1 tablet (20 mg total)  by mouth daily. 90 tablet 1  . esomeprazole (NEXIUM) 40 MG capsule Take 1 capsule (40 mg total) by mouth daily. 30 capsule 3  . SUMAtriptan (IMITREX) 100 MG tablet Take 1 tablet (100 mg total) by mouth daily as needed for migraine. 10 tablet 3  . topiramate (TOPAMAX) 100 MG tablet TAKE 1 TABLET BY MOUTH AT BEDTIME 30 tablet 1   No current facility-administered medications on file prior to  visit.   Past Medical History:  Diagnosis Date  . Depression   . Hyperlipidemia   . Migraine    No Known Allergies  Social History   Socioeconomic History  . Marital status: Married    Spouse name: Not on file  . Number of children: Not on file  . Years of education: Not on file  . Highest education level: Not on file  Occupational History  . Not on file  Tobacco Use  . Smoking status: Former Research scientist (life sciences)  . Smokeless tobacco: Never Used  Substance and Sexual Activity  . Alcohol use: No  . Drug use: Never  . Sexual activity: Yes    Birth control/protection: None  Other Topics Concern  . Not on file  Social History Narrative   ** Merged History Encounter **       Social Determinants of Health   Financial Resource Strain:   . Difficulty of Paying Living Expenses: Not on file  Food Insecurity:   . Worried About Charity fundraiser in the Last Year: Not on file  . Ran Out of Food in the Last Year: Not on file  Transportation Needs:   . Lack of Transportation (Medical): Not on file  . Lack of Transportation (Non-Medical): Not on file  Physical Activity:   . Days of Exercise per Week: Not on file  . Minutes of Exercise per Session: Not on file  Stress:   . Feeling of Stress : Not on file  Social Connections:   . Frequency of Communication with Friends and Family: Not on file  . Frequency of Social Gatherings with Friends and Family: Not on file  . Attends Religious Services: Not on file  . Active Member of Clubs or Organizations: Not on file  . Attends Archivist Meetings: Not on file  . Marital Status: Not on file    Vitals:   03/14/20 0850  BP: 120/78  Pulse: 94  Resp: 16  Temp: 98.4 F (36.9 C)  SpO2: 99%   Wt Readings from Last 3 Encounters:  03/14/20 204 lb 8 oz (92.8 kg)  02/09/20 205 lb (93 kg)  01/25/20 205 lb 8 oz (93.2 kg)    Body mass index is 33.01 kg/m.  Physical Exam Vitals and nursing note reviewed.  Constitutional:       General: She is not in acute distress.    Appearance: She is well-developed.  HENT:     Head: Normocephalic and atraumatic.     Mouth/Throat:     Mouth: Mucous membranes are moist.     Pharynx: Oropharynx is clear.  Eyes:     Conjunctiva/sclera: Conjunctivae normal.     Pupils: Pupils are equal, round, and reactive to light.  Cardiovascular:     Rate and Rhythm: Normal rate and regular rhythm.     Pulses:          Dorsalis pedis pulses are 2+ on the right side and 2+ on the left side.     Heart sounds: No murmur heard.  Comments: Foot dorsi-flexion does not elicit calf pain, bilateral.  Pulmonary:     Effort: Pulmonary effort is normal. No respiratory distress.     Breath sounds: Normal breath sounds.  Abdominal:     Palpations: Abdomen is soft. There is no hepatomegaly or mass.     Tenderness: There is no abdominal tenderness.  Musculoskeletal:       Back:     Comments: Right Tinel and Phalen positive. Tender trigger point on upper and lower back, left; chest wall.   Lymphadenopathy:     Cervical: No cervical adenopathy.  Skin:    General: Skin is warm.     Findings: No erythema or rash.  Neurological:     Mental Status: She is alert and oriented to person, place, and time.     Cranial Nerves: No cranial nerve deficit.     Gait: Gait normal.  Psychiatric:        Mood and Affect: Affect normal. Mood is anxious.     Comments: Well groomed, good eye contact.    ASSESSMENT AND PLAN:  Ms.Cleopatra was seen today for numbness and coldness.  Diagnoses and all orders for this visit: Orders Placed This Encounter  Procedures  . DG Chest 2 View  . C-reactive protein  . Sedimentation rate  . ANA  . Hemoglobin A1c  . Ambulatory referral to Neurology  . VAS [place] ABI WITH/WO TBI   Lab Results  Component Value Date   HGBA1C 5.4 03/14/2020   Lab Results  Component Value Date   ESRSEDRATE 19 03/14/2020   Lab Results  Component Value Date   CRP 7.6 03/14/2020     SOB (shortness of breath) on exertion This problem seems to be chronic but getting worse for the past couple weeks. Lung auscultation today negative. ?  Deconditioning. GERD can also be a contributing factor. We may need to consider evaluation by pulmonologist and/or cardiologist. Further recommendations according to CXR.  Numbness Lower extremities. This seems to be a chronic pain. We discussed possible etiologies:?  Peripheral neuropathy, radiculopathy. Appointment with neurology will be arranged. Continue monitoring for new symptoms.  Myalgia ?  Fibromyalgia. Examination today do not suggest a serious process. We will continue following. Further recommendation will be given according to lab results. We could consider trial of medication.  Carpal tunnel syndrome of right wrist Recommend waiting a splint at night. If not better we may need to consider Ortho evaluation.  Gastroesophageal reflux disease, unspecified whether esophagitis present Problem is better controlled. No changes seen dose. GERD precautions also important.  Upper back pain on left side Musculoskeletal most likely. No history of trauma, so I do not think imaging is needed at this time.  Bilateral cold feet Peripheral pulses are present. Raynaud phenomenon. Recommend avoid cold exposure and monitor for ulcer/wound. Rheumatology disorder to be considered, further recommendation will be given according to lab/ABI results.   Spent 41 minutes with pt.  During this time history was obtained and documented, examination was performed, prior labs/imaging reviewed, and assessment/plan discussed.   Return if symptoms worsen or fail to improve.   Kelie Gainey G. [place], MD  [facility]. [place] office.   A few things to remember from today's visit:   Numbness - Plan: C-reactive protein, Hemoglobin A1c, Ambulatory referral to Neurology  Carpal tunnel syndrome of right  wrist  Gastroesophageal reflux disease, unspecified whether esophagitis present  Upper back pain on left side  Myalgia  Bilateral cold feet - Plan: C-reactive protein, Sedimentation  rate, ANA  SOB (shortness of breath) on exertion - Plan: DG Chest 2 View, VAS [place] ABI WITH/WO TBI  Right wrist splint to wear at night may help with right hand numbness.  If you need refills please call your pharmacy. Do not use My Chart to request refills or for acute issues that need immediate attention.    Please be sure medication list is accurate. If a new problem present, please set up appointment sooner than planned today.   Myofascial Pain Syndrome and Fibromyalgia Myofascial pain syndrome and fibromyalgia are both pain disorders. This pain may be felt mainly in your muscles.  Myofascial pain syndrome: ? Always has tender points in the muscle that will cause pain when pressed (trigger points). The pain may come and go. ? Usually affects your neck, upper back, and shoulder areas. The pain often radiates into your arms and hands.  Fibromyalgia: ? Has muscle pains and tenderness that come and go. ? Is often associated with fatigue and sleep problems. ? Has trigger points. ? Tends to be long-lasting (chronic), but is not life-threatening. Fibromyalgia and myofascial pain syndrome are not the same. However, they often occur together. If you have both conditions, each can make the other worse. Both are common and can cause enough pain and fatigue to make day-to-day activities difficult. Both can be hard to diagnose because their symptoms are common in many other conditions. What are the causes? The exact causes of these conditions are not known. What increases the risk? You are more likely to develop this condition if:  You have a family history of the condition.  You have certain triggers, such as: ? Spine disorders. ? An injury (trauma) or other physical stressors. ? Being under a lot of  stress. ? Medical conditions such as osteoarthritis, rheumatoid arthritis, or lupus. What are the signs or symptoms? Fibromyalgia The main symptom of fibromyalgia is widespread pain and tenderness in your muscles. Pain is sometimes described as stabbing, shooting, or burning. You may also have:  Tingling or numbness.  Sleep problems and fatigue.  Problems with attention and concentration (fibro fog). Other symptoms may include:  Bowel and bladder problems.  Headaches.  Visual problems.  Problems with odors and noises.  Depression or mood changes.  Painful menstrual periods (dysmenorrhea).  Dry skin or eyes. These symptoms can vary over time. Myofascial pain syndrome Symptoms of myofascial pain syndrome include:  Tight, ropy bands of muscle.  Uncomfortable sensations in muscle areas. These may include aching, cramping, burning, numbness, tingling, and weakness.  Difficulty moving certain parts of the body freely (poor range of motion). How is this diagnosed? This condition may be diagnosed by your symptoms and medical history. You will also have a physical exam. In general:  Fibromyalgia is diagnosed if you have pain, fatigue, and other symptoms for more than 3 months, and symptoms cannot be explained by another condition.  Myofascial pain syndrome is diagnosed if you have trigger points in your muscles, and those trigger points are tender and cause pain elsewhere in your body (referred pain). How is this treated? Treatment for these conditions depends on the type that you have.  For fibromyalgia: ? Pain medicines, such as NSAIDs. ? Medicines for treating depression. ? Medicines for treating seizures. ? Medicines that relax the muscles.  For myofascial pain: ? Pain medicines, such as NSAIDs. ? Cooling and stretching of muscles. ? Trigger point injections. ? Sound wave (ultrasound) treatments to stimulate muscles. Treating these conditions often requires a  team  of health care providers. These may include:  Your primary care provider.  Physical therapist.  Complementary health care providers, such as massage therapists or acupuncturists.  Psychiatrist for cognitive behavioral therapy. Follow these instructions at home: Medicines  Take over-the-counter and prescription medicines only as told by your health care provider.  Do not drive or use heavy machinery while taking prescription pain medicine.  If you are taking prescription pain medicine, take actions to prevent or treat constipation. Your health care provider may recommend that you: ? Drink enough fluid to keep your urine pale yellow. ? Eat foods that are high in fiber, such as fresh fruits and vegetables, whole grains, and beans. ? Limit foods that are high in fat and processed sugars, such as fried or sweet foods. ? Take an over-the-counter or prescription medicine for constipation. Lifestyle   Exercise as directed by your health care provider or physical therapist.  Practice relaxation techniques to control your stress. You may want to try: ? Biofeedback. ? Visual imagery. ? Hypnosis. ? Muscle relaxation. ? Yoga. ? Meditation.  Maintain a healthy lifestyle. This includes eating a healthy diet and getting enough sleep.  Do not use any products that contain nicotine or tobacco, such as cigarettes and e-cigarettes. If you need help quitting, ask your health care provider. General instructions  Talk to your health care provider about complementary treatments, such as acupuncture or massage.  Consider joining a support group with others who are diagnosed with this condition.  Do not do activities that stress or strain your muscles. This includes repetitive motions and heavy lifting.  Keep all follow-up visits as told by your health care provider. This is important. Where to find more information  National Fibromyalgia Association: www.fmaware.[place]:  www.arthritis.org  American Chronic Pain Association: www.theacpa.org Contact a health care provider if:  You have new symptoms.  Your symptoms get worse or your pain is severe.  You have side effects from your medicines.  You have trouble sleeping.  Your condition is causing depression or anxiety. Summary  Myofascial pain syndrome and fibromyalgia are pain disorders.  Myofascial pain syndrome has tender points in the muscle that will cause pain when pressed (trigger points). Fibromyalgia also has muscle pains and tenderness that come and go, but this condition is often associated with fatigue and sleep disturbances.  Fibromyalgia and myofascial pain syndrome are not the same but often occur together, causing pain and fatigue that make day-to-day activities difficult.  Treatment for fibromyalgia includes taking medicines to relax the muscles and medicines for pain, depression, or seizures. Treatment for myofascial pain syndrome includes taking medicines for pain, cooling and stretching of muscles, and injecting medicines into trigger points.  Follow your health care provider's instructions for taking medicines and maintaining a healthy lifestyle. This information is not intended to replace advice given to you by your health care provider. Make sure you discuss any questions you have with your health care provider. Document Revised: 10/03/2018 Document Reviewed: 06/26/2017 Elsevier Patient Education  2020 [employer].

## 2020-03-14 NOTE — Patient Instructions (Signed)
A few things to remember from today's visit:   Numbness - Plan: C-reactive protein, Hemoglobin A1c, Ambulatory referral to Neurology  Carpal tunnel syndrome of right wrist  Gastroesophageal reflux disease, unspecified whether esophagitis present  Upper back pain on left side  Myalgia  Bilateral cold feet - Plan: C-reactive protein, Sedimentation rate, ANA  SOB (shortness of breath) on exertion - Plan: DG Chest 2 View, VAS [place] ABI WITH/WO TBI  Right wrist splint to wear at night may help with right hand numbness.  If you need refills please call your pharmacy. Do not use My Chart to request refills or for acute issues that need immediate attention.    Please be sure medication list is accurate. If a new problem present, please set up appointment sooner than planned today.   Myofascial Pain Syndrome and Fibromyalgia Myofascial pain syndrome and fibromyalgia are both pain disorders. This pain may be felt mainly in your muscles.  Myofascial pain syndrome: ? Always has tender points in the muscle that will cause pain when pressed (trigger points). The pain may come and go. ? Usually affects your neck, upper back, and shoulder areas. The pain often radiates into your arms and hands.  Fibromyalgia: ? Has muscle pains and tenderness that come and go. ? Is often associated with fatigue and sleep problems. ? Has trigger points. ? Tends to be long-lasting (chronic), but is not life-threatening. Fibromyalgia and myofascial pain syndrome are not the same. However, they often occur together. If you have both conditions, each can make the other worse. Both are common and can cause enough pain and fatigue to make day-to-day activities difficult. Both can be hard to diagnose because their symptoms are common in many other conditions. What are the causes? The exact causes of these conditions are not known. What increases the risk? You are more likely to develop this condition if:  You have  a family history of the condition.  You have certain triggers, such as: ? Spine disorders. ? An injury (trauma) or other physical stressors. ? Being under a lot of stress. ? Medical conditions such as osteoarthritis, rheumatoid arthritis, or lupus. What are the signs or symptoms? Fibromyalgia The main symptom of fibromyalgia is widespread pain and tenderness in your muscles. Pain is sometimes described as stabbing, shooting, or burning. You may also have:  Tingling or numbness.  Sleep problems and fatigue.  Problems with attention and concentration (fibro fog). Other symptoms may include:  Bowel and bladder problems.  Headaches.  Visual problems.  Problems with odors and noises.  Depression or mood changes.  Painful menstrual periods (dysmenorrhea).  Dry skin or eyes. These symptoms can vary over time. Myofascial pain syndrome Symptoms of myofascial pain syndrome include:  Tight, ropy bands of muscle.  Uncomfortable sensations in muscle areas. These may include aching, cramping, burning, numbness, tingling, and weakness.  Difficulty moving certain parts of the body freely (poor range of motion). How is this diagnosed? This condition may be diagnosed by your symptoms and medical history. You will also have a physical exam. In general:  Fibromyalgia is diagnosed if you have pain, fatigue, and other symptoms for more than 3 months, and symptoms cannot be explained by another condition.  Myofascial pain syndrome is diagnosed if you have trigger points in your muscles, and those trigger points are tender and cause pain elsewhere in your body (referred pain). How is this treated? Treatment for these conditions depends on the type that you have.  For fibromyalgia: ? Pain  medicines, such as NSAIDs. ? Medicines for treating depression. ? Medicines for treating seizures. ? Medicines that relax the muscles.  For myofascial pain: ? Pain medicines, such as  NSAIDs. ? Cooling and stretching of muscles. ? Trigger point injections. ? Sound wave (ultrasound) treatments to stimulate muscles. Treating these conditions often requires a team of health care providers. These may include:  Your primary care provider.  Physical therapist.  Complementary health care providers, such as massage therapists or acupuncturists.  Psychiatrist for cognitive behavioral therapy. Follow these instructions at home: Medicines  Take over-the-counter and prescription medicines only as told by your health care provider.  Do not drive or use heavy machinery while taking prescription pain medicine.  If you are taking prescription pain medicine, take actions to prevent or treat constipation. Your health care provider may recommend that you: ? Drink enough fluid to keep your urine pale yellow. ? Eat foods that are high in fiber, such as fresh fruits and vegetables, whole grains, and beans. ? Limit foods that are high in fat and processed sugars, such as fried or sweet foods. ? Take an over-the-counter or prescription medicine for constipation. Lifestyle   Exercise as directed by your health care provider or physical therapist.  Practice relaxation techniques to control your stress. You may want to try: ? Biofeedback. ? Visual imagery. ? Hypnosis. ? Muscle relaxation. ? Yoga. ? Meditation.  Maintain a healthy lifestyle. This includes eating a healthy diet and getting enough sleep.  Do not use any products that contain nicotine or tobacco, such as cigarettes and e-cigarettes. If you need help quitting, ask your health care provider. General instructions  Talk to your health care provider about complementary treatments, such as acupuncture or massage.  Consider joining a support group with others who are diagnosed with this condition.  Do not do activities that stress or strain your muscles. This includes repetitive motions and heavy lifting.  Keep all  follow-up visits as told by your health care provider. This is important. Where to find more information  National Fibromyalgia Association: www.fmaware.[place]: www.arthritis.org  American Chronic Pain Association: www.theacpa.org Contact a health care provider if:  You have new symptoms.  Your symptoms get worse or your pain is severe.  You have side effects from your medicines.  You have trouble sleeping.  Your condition is causing depression or anxiety. Summary  Myofascial pain syndrome and fibromyalgia are pain disorders.  Myofascial pain syndrome has tender points in the muscle that will cause pain when pressed (trigger points). Fibromyalgia also has muscle pains and tenderness that come and go, but this condition is often associated with fatigue and sleep disturbances.  Fibromyalgia and myofascial pain syndrome are not the same but often occur together, causing pain and fatigue that make day-to-day activities difficult.  Treatment for fibromyalgia includes taking medicines to relax the muscles and medicines for pain, depression, or seizures. Treatment for myofascial pain syndrome includes taking medicines for pain, cooling and stretching of muscles, and injecting medicines into trigger points.  Follow your health care provider's instructions for taking medicines and maintaining a healthy lifestyle. This information is not intended to replace advice given to you by your health care provider. Make sure you discuss any questions you have with your health care provider. Document Revised: 10/03/2018 Document Reviewed: 06/26/2017 Elsevier Patient Education  2020 [employer].

## 2020-03-15 ENCOUNTER — Ambulatory Visit (HOSPITAL_COMMUNITY)
Admission: RE | Admit: 2020-03-15 | Discharge: 2020-03-15 | Disposition: A | Discharge status: Home / Self Care | Payer: Managed Care, Other (non HMO) | Source: Ambulatory Visit | Attending: Family Medicine | Admitting: Family Medicine

## 2020-03-15 DIAGNOSIS — R0602 Shortness of breath: Secondary | ICD-10-CM | POA: Diagnosis not present

## 2020-03-15 LAB — HEMOGLOBIN A1C
Hgb A1c MFr Bld: 5.4 % of total Hgb (ref <5.7)
Mean Plasma Glucose: 108 (calc)
eAG (mmol/L): 6 (calc)

## 2020-03-16 ENCOUNTER — Encounter: Payer: Self-pay | Admitting: Family Medicine

## 2020-03-16 ENCOUNTER — Encounter: Payer: Self-pay | Admitting: Neurology

## 2020-03-16 LAB — ANA: Anti Nuclear Antibody (ANA): NEGATIVE

## 2020-03-16 LAB — C-REACTIVE PROTEIN: CRP: 7.6 mg/L (ref <8.0)

## 2020-03-16 LAB — SEDIMENTATION RATE: Sed Rate: 19 mm/h (ref 0–20)

## 2020-03-25 ENCOUNTER — Encounter: Payer: Self-pay | Admitting: Neurology

## 2020-03-25 ENCOUNTER — Other Ambulatory Visit: Payer: Self-pay | Admitting: Family Medicine

## 2020-03-25 ENCOUNTER — Ambulatory Visit (INDEPENDENT_AMBULATORY_CARE_PROVIDER_SITE_OTHER): Payer: Managed Care, Other (non HMO) | Admitting: Neurology

## 2020-03-25 ENCOUNTER — Other Ambulatory Visit: Payer: Self-pay

## 2020-03-25 VITALS — BP 131/84 | HR 88 | Ht 66.0 in | Wt 202.0 lb

## 2020-03-25 DIAGNOSIS — R208 Other disturbances of skin sensation: Secondary | ICD-10-CM

## 2020-03-25 DIAGNOSIS — R0602 Shortness of breath: Secondary | ICD-10-CM

## 2020-03-25 NOTE — Progress Notes (Signed)
[facility] Clinic Note - Initial Visit   Date: 03/25/20  Jeanne Payne MRN: [id number] DOB: Payne   Dear Dr. Martinique:  Thank you for your kind referral of Jeanne Payne [facility] for consultation of numbness/tingling. Although her history is well known to you, please allow [place] to reiterate it for the purpose of our medical record. The patient was accompanied to the clinic by self.     History of Present Illness: Jeanne Payne is a 49 y.o. right-handed female with depression, hyperlipidemia, migraine, and GERD presenting for evaluation of abnormal feet sensation.   For the past year, she has cold sensation of the feet and chronic achy/throbbing leg pain.  Over the past several weeks, her feet discomfort has intensified and she has increased coldness and sometimes feels that her toes are numb. She has a history of right leg DVT and is worried about this.  Her vascular studies have been normal. She also complaints of whole body pain and says that anytime she hurts herself,por even light pressure, can be more painful than it used to be. Fibromyalgia has been raised by her PCP.   Out-side paper records, electronic medical record, and images have been reviewed where available and summarized as:  Lab Results  Component Value Date   HGBA1C 5.4 03/14/2020   No results found for: [id number] Lab Results  Component Value Date   TSH 1.807 07/21/2019   Lab Results  Component Value Date   ESRSEDRATE 19 03/14/2020    Past Medical History:  Diagnosis Date  . Blood clot in vein    Blood Clot in right leg  . Depression   . Hyperlipidemia   . Migraine     Past Surgical History:  Procedure Laterality Date  . TUBAL LIGATION       Medications:  Outpatient Encounter Medications as of 03/25/2020  Medication Sig  . atorvastatin (LIPITOR) 40 MG tablet Take 40 mg by mouth daily.  Marland Kitchen escitalopram (LEXAPRO) 20 MG tablet Take 1  tablet (20 mg total) by mouth daily.  Marland Kitchen esomeprazole (NEXIUM) 40 MG capsule Take 1 capsule (40 mg total) by mouth daily.  . promethazine (PHENERGAN) 25 MG tablet Take 25 mg by mouth every 6 (six) hours as needed for nausea or vomiting.  . SUMAtriptan (IMITREX) 100 MG tablet Take 1 tablet (100 mg total) by mouth daily as needed for migraine.  . topiramate (TOPAMAX) 100 MG tablet TAKE 1 TABLET BY MOUTH AT BEDTIME   No facility-administered encounter medications on file as of 03/25/2020.    Allergies:  Allergies  Allergen Reactions  . Benadryl [Diphenhydramine]     Restless legs    Family History: Family History  Problem Relation Age of Onset  . Hypertension Mother   . Depression Mother   . Alcohol abuse Father   . Asthma Father   . Cancer Father   . COPD Father   . Diabetes Father   . Hearing loss Father   . Heart disease Father   . Depression Sister   . Early death Sister   . Alcohol abuse Brother   . Drug abuse Brother   . Arthritis Paternal Grandmother   . COPD Paternal Grandmother   . Hearing loss Paternal Grandmother   . Breast cancer Paternal Grandmother     Social History: Social History   Tobacco Use  . Smoking status: Former Research scientist (life sciences)  . Smokeless tobacco: Never Used  Substance Use Topics  . Alcohol use: No  . Drug  use: Never   Social History   Social History Narrative   ** Merged History Encounter **    Right Handed   Lives in a one story home    Vital Signs:  BP 131/84   Pulse 88   Ht 5\' 6"  (1.676 m)   Wt 202 lb (91.6 kg)   SpO2 98%   BMI 32.60 kg/m     Neurological Exam: MENTAL STATUS including orientation to time, place, person, recent and remote memory, attention span and concentration, language, and fund of knowledge is normal.  Speech is not dysarthric.  CRANIAL NERVES: II:  No visual field defects.   III-IV-VI: Pupils equal round and reactive to light.  Normal conjugate, extra-ocular eye movements in all directions of gaze.  No  nystagmus.  No ptosis.   VII:  Normal facial symmetry and movements.   VIII:  Normal hearing and vestibular function.   XI:  Normal shoulder shrug and head rotation.    MOTOR:  No atrophy, fasciculations or abnormal movements.  No pronator drift.   Upper Extremity:  Right  Left  Deltoid  5/5   5/5   Biceps  5/5   5/5   Triceps  5/5   5/5   Infraspinatus 5/5  5/5  Medial pectoralis 5/5  5/5  Wrist extensors  5/5   5/5   Wrist flexors  5/5   5/5   Finger extensors  5/5   5/5   Finger flexors  5/5   5/5   Dorsal interossei  5/5   5/5   Abductor pollicis  5/5   5/5   Tone (Ashworth scale)  0  0   Lower Extremity:  Right  Left  Hip flexors  5/5   5/5   Hip extensors  5/5   5/5   Adductor 5/5  5/5  Abductor 5/5  5/5  Knee flexors  5/5   5/5   Knee extensors  5/5   5/5   Dorsiflexors  5/5   5/5   Plantarflexors  5/5   5/5   Toe extensors  5/5   5/5   Toe flexors  5/5   5/5   Tone (Ashworth scale)  0  0   MSRs:  Right        Left                  brachioradialis 2+  2+  biceps 2+  2+  triceps 2+  2+  patellar 2+  2+  ankle jerk 2+  2+  Hoffman no  no  plantar response down  down   SENSORY:  Normal and symmetric perception of light touch, pinprick, vibration, and proprioception.    COORDINATION/GAIT: Normal finger-to- nose-finger and heel-to-shin.  Intact rapid alternating movements bilaterally.  Able to rise from a chair without using arms.  Gait narrow based and stable. Tandem and stressed gait intact.    IMPRESSION: Bilateral feet dysesthesias, symptoms and exam is not characteristic for neuropathy.  To be complete, she will return for NCS/EMG of the legs but my overall suspicion for primary nerve pathology is very low.    I suspect that she does have fibromyalgia given the diffuse nature of her pain.  She was informed that neurological conditions does not manifest with such widespread pain and my concern for CNS disorder, such as MS is very low.  Patient made aware we  do not manage fibromyalgia.   Further recommendations pending results.   Thank you for  allowing me to participate in patient's care.  If I can answer any additional questions, I would be pleased to do so.    Sincerely,    Jeanne Kindig K. Posey Pronto, DO

## 2020-03-25 NOTE — Patient Instructions (Signed)

## 2020-03-26 ENCOUNTER — Other Ambulatory Visit: Payer: Self-pay | Admitting: Family Medicine

## 2020-03-26 DIAGNOSIS — K219 Gastro-esophageal reflux disease without esophagitis: Secondary | ICD-10-CM

## 2020-03-30 ENCOUNTER — Telehealth: Payer: Self-pay | Admitting: Family Medicine

## 2020-03-30 ENCOUNTER — Ambulatory Visit: Payer: Managed Care, Other (non HMO) | Admitting: Family Medicine

## 2020-03-30 NOTE — Telephone Encounter (Signed)
Patient decided to decline appt. Pt was advised to seek care at [facility] or ER if symptoms worsen. Appt canceled.

## 2020-03-30 NOTE — Telephone Encounter (Signed)
Called pt and she declined the appointment today with Dr. Martinique and stated that she will go to ER or UC if she feels that she is getting worse.  Pt would like to have a [place] to assist her with the restless leg syndrome.  I let Judson Roch know what the pt stated and she stated that she will cancel the appointment today.

## 2020-03-30 NOTE — Telephone Encounter (Signed)
Noted  

## 2020-03-30 NOTE — Telephone Encounter (Signed)
Triage Nurse called in stating that the pt called in and stated that she has SOB and increasing restless leg and was advised to go to the ER or UC. Diane Triage nurse stated that she will call the pt back to let her know what our recommendation is is for her to go to [facility] or ER if it get worse.  I spoke with Roselyn Reef and she stated that the pt needs to go to the ER or UC if Dr. Martinique is not able to get her in today.  I spoke with Judson Roch and she will put the pt on for today at 4:30 but if things get worse the pt is to go to the ER or UC.  I called the pt to make her aware of the appointment with Dr. Martinique.

## 2020-03-31 ENCOUNTER — Emergency Department (HOSPITAL_COMMUNITY)
Admission: EM | Admit: 2020-03-31 | Discharge: 2020-03-31 | Disposition: A | Discharge status: Home / Self Care | Payer: Managed Care, Other (non HMO) | Attending: Emergency Medicine | Admitting: Emergency Medicine

## 2020-03-31 ENCOUNTER — Encounter (HOSPITAL_COMMUNITY): Payer: Self-pay

## 2020-03-31 ENCOUNTER — Encounter: Payer: Self-pay | Admitting: Family Medicine

## 2020-03-31 ENCOUNTER — Emergency Department (HOSPITAL_BASED_OUTPATIENT_CLINIC_OR_DEPARTMENT_OTHER): Payer: Managed Care, Other (non HMO)

## 2020-03-31 ENCOUNTER — Emergency Department (HOSPITAL_COMMUNITY): Payer: Managed Care, Other (non HMO)

## 2020-03-31 DIAGNOSIS — M79605 Pain in left leg: Secondary | ICD-10-CM | POA: Insufficient documentation

## 2020-03-31 DIAGNOSIS — Z87891 Personal history of nicotine dependence: Secondary | ICD-10-CM | POA: Insufficient documentation

## 2020-03-31 DIAGNOSIS — R52 Pain, unspecified: Secondary | ICD-10-CM

## 2020-03-31 DIAGNOSIS — R609 Edema, unspecified: Secondary | ICD-10-CM

## 2020-03-31 DIAGNOSIS — M79604 Pain in right leg: Secondary | ICD-10-CM | POA: Insufficient documentation

## 2020-03-31 DIAGNOSIS — Z86718 Personal history of other venous thrombosis and embolism: Secondary | ICD-10-CM | POA: Diagnosis not present

## 2020-03-31 DIAGNOSIS — R079 Chest pain, unspecified: Secondary | ICD-10-CM | POA: Diagnosis not present

## 2020-03-31 DIAGNOSIS — M25511 Pain in right shoulder: Secondary | ICD-10-CM | POA: Diagnosis not present

## 2020-03-31 DIAGNOSIS — R0602 Shortness of breath: Secondary | ICD-10-CM | POA: Diagnosis present

## 2020-03-31 LAB — CBC
HCT: 44 % (ref 36.0–46.0)
Hemoglobin: 14.3 g/dL (ref 12.0–15.0)
MCH: 29.4 pg (ref 26.0–34.0)
MCHC: 32.5 g/dL (ref 30.0–36.0)
MCV: 90.3 fL (ref 80.0–100.0)
Platelets: 259 10*3/uL (ref 150–400)
RBC: 4.87 MIL/uL (ref 3.87–5.11)
RDW: 13.2 % (ref 11.5–15.5)
WBC: 6.2 10*3/uL (ref 4.0–10.5)
nRBC: 0 % (ref 0.0–0.2)

## 2020-03-31 LAB — BASIC METABOLIC PANEL
Anion gap: 8 (ref 5–15)
BUN: 6 mg/dL (ref 6–20)
CO2: 24 mmol/L (ref 22–32)
Calcium: 9.5 mg/dL (ref 8.9–10.3)
Chloride: 108 mmol/L (ref 98–111)
Creatinine, Ser: 0.9 mg/dL (ref 0.44–1.00)
GFR calc non Af Amer: >60 mL/min (ref >60)
Glucose, Bld: 100 mg/dL — ABNORMAL HIGH (ref 70–99)
Potassium: 4.1 mmol/L (ref 3.5–5.1)
Sodium: 140 mmol/L (ref 135–145)

## 2020-03-31 LAB — I-STAT BETA HCG BLOOD, ED (MC, WL, AP ONLY): I-stat hCG, quantitative: <5 m[IU]/mL (ref <5)

## 2020-03-31 LAB — D-DIMER, QUANTITATIVE: D-Dimer, Quant: 0.5 ug/mL-FEU (ref 0.00–0.50)

## 2020-03-31 MED ORDER — IOHEXOL 350 MG/ML SOLN
65.0000 mL | Freq: Once | INTRAVENOUS | Status: AC | PRN
  Administered 2020-03-31: 65 mL via INTRAVENOUS

## 2020-03-31 NOTE — Discharge Instructions (Addendum)
You may have restless leg syndrome.  Please talk to your doctor about your symptoms.  Your ultrasound and CAT scan today did not show any blood clots.  Return to ER if you have worse shortness of breath, chest pain, leg pain

## 2020-03-31 NOTE — Progress Notes (Signed)
Lower extremity venous has been completed.   Preliminary results in CV Proc.   Abram Sander 03/31/2020 2:02 PM

## 2020-03-31 NOTE — ED Provider Notes (Signed)
[facility] Provider Note   CSN: [id number] Arrival date & time: 03/31/20  1114     History Chief Complaint  Patient presents with  . Shortness of Breath  . Leg Pain    Jeanne Payne is a 49 y.o. female.  HPI    49 yo female with history of dvt, chronic lower extremity pain worsening with movmement, chest and shoulder discomfort, right scapula pain with eating.  RLE pain has been chronic for years.  She had a rle dvt diagnosed last January, but it she is unable to say if anything was different at that time about the pain and some associated swelling.  She completed her anticoagulants and has been off anticoagulation for several months.  She is reports that she has had bilateral shoulder discomfort, again for extended period of time.  Is unclear exactly what makes this worse.  She has some dyspnea.  She has a history of smoking but has quit since May 2020.  She denies fever, chills, nasal congestion, sore throat, nausea, vomiting, diarrhea, or weight loss. Past Medical History:  Diagnosis Date  . Blood clot in vein    Blood Clot in right leg  . Depression   . Hyperlipidemia   . Migraine     Patient Active Problem List   Diagnosis Date Noted  . Chronic fatigue 01/25/2020  . Class 1 obesity with body mass index (BMI) of 33.0 to 33.9 in adult 01/25/2020  . Migraine headache with aura 10/20/2019  . GERD (gastroesophageal reflux disease) 10/20/2019  . Anxiety disorder 10/20/2019  . Hyperlipidemia 07/22/2019    Past Surgical History:  Procedure Laterality Date  . TUBAL LIGATION       OB History    Gravida  3   Para      Term      Preterm      AB  0   Living  3     SAB  0   TAB      Ectopic  0   Multiple      Live Births              Family History  Problem Relation Age of Onset  . Hypertension Mother   . Depression Mother   . Alcohol abuse Father   . Asthma Father   . Cancer Father   . COPD  Father   . Diabetes Father   . Hearing loss Father   . Heart disease Father   . Depression Sister   . Early death Sister   . Alcohol abuse Brother   . Drug abuse Brother   . Arthritis Paternal Grandmother   . COPD Paternal Grandmother   . Hearing loss Paternal Grandmother   . Breast cancer Paternal Grandmother     Social History   Tobacco Use  . Smoking status: Former Research scientist (life sciences)  . Smokeless tobacco: Never Used  Substance Use Topics  . Alcohol use: No  . Drug use: Never    Home Medications Prior to Admission medications   Medication Sig Start Date End Date Taking? Authorizing Provider  atorvastatin (LIPITOR) 40 MG tablet Take 40 mg by mouth daily.   Yes [provider]  esomeprazole (NEXIUM) 40 MG capsule TAKE 1 CAPSULE(40 MG) BY MOUTH DAILY Patient taking differently: Take 40 mg by mouth in the morning.  03/28/20  Yes Martinique, Betty G, MD  promethazine (PHENERGAN) 25 MG tablet Take 25 mg by mouth every 6 (six) hours as  needed for nausea or vomiting.   Yes [provider]  SUMAtriptan (IMITREX) 100 MG tablet Take 1 tablet (100 mg total) by mouth daily as needed for migraine. 10/20/19  Yes Martinique, Betty G, MD  topiramate (TOPAMAX) 100 MG tablet TAKE 1 TABLET BY MOUTH AT BEDTIME 02/19/20  Yes Martinique, Betty G, MD  escitalopram (LEXAPRO) 20 MG tablet Take 1 tablet (20 mg total) by mouth daily. Patient not taking: Reported on 03/31/2020 10/20/19   Martinique, Betty G, MD    Allergies    Benadryl [diphenhydramine]  Review of Systems   Review of Systems  All other systems reviewed and are negative.   Physical Exam Updated Vital Signs BP (!) 141/70   Pulse 69   Temp 97.7 F (36.5 C) (Oral)   Resp 20   SpO2 100%   Physical Exam Vitals and nursing note reviewed.  Constitutional:      General: She is not in acute distress.    Appearance: She is well-developed. She is not ill-appearing.  HENT:     Head: Normocephalic.     Mouth/Throat:     Mouth: Mucous membranes  are moist.  Eyes:     Pupils: Pupils are equal, round, and reactive to light.  Cardiovascular:     Rate and Rhythm: Normal rate and regular rhythm.  Pulmonary:     Effort: Pulmonary effort is normal.     Breath sounds: Normal breath sounds.  Chest:     Chest wall: No mass.  Abdominal:     General: Bowel sounds are normal.     Palpations: Abdomen is soft.  Musculoskeletal:        General: Normal range of motion.     Cervical back: Normal range of motion.  Skin:    General: Skin is warm and dry.     Capillary Refill: Capillary refill takes less than 2 seconds.  Neurological:     General: No focal deficit present.     Mental Status: She is alert.  Psychiatric:        Mood and Affect: Mood normal.     ED Results / Procedures / Treatments   Labs (all labs ordered are listed, but only abnormal results are displayed) Labs Reviewed  BASIC METABOLIC PANEL - Abnormal; Notable for the following components:      Result Value   Glucose, Bld 100 (*)    All other components within normal limits  CBC  D-DIMER, QUANTITATIVE (NOT AT [facility])  I-STAT BETA HCG BLOOD, ED ([facility], [facility], [facility] ONLY)    EKG EKG Interpretation  Date/Time:  Thursday March 31 2020 11:19:44 EDT Ventricular Rate:  82 PR Interval:  140 QRS Duration: 80 QT Interval:  364 QTC Calculation: 425 R Axis:   43 Text Interpretation: Normal sinus rhythm with sinus arrhythmia Normal ECG Confirmed by Pattricia Boss [phone]) on 03/31/2020 3:06:22 PM   Radiology DG Chest 2 View  Result Date: 03/31/2020 CLINICAL DATA:  49 year old female shortness of breath with exertion. History of lower extremity blood clot in January. EXAM: CHEST - 2 VIEW COMPARISON:  Chest radiographs 03/14/2020 and earlier. FINDINGS: Mildly lower lung volumes today. Mediastinal contours remain normal. Visualized tracheal air column is within normal limits. Lungs remain clear. No pneumothorax or pleural effusion. No acute osseous abnormality identified. Negative  visible bowel gas pattern. IMPRESSION: Negative.  No cardiopulmonary abnormality. Electronically Signed   By: Genevie Ann M.D.   On: 03/31/2020 11:43   VAS [place] LOWER EXTREMITY VENOUS (DVT) ([facility] and  [facility] 7a-7p)  Result Date: 03/31/2020  Lower Venous DVTStudy Indications: Pain, and Edema.  Comparison Study: no prior Performing Technologist: Abram Sander RVS  Examination Guidelines: A complete evaluation includes B-mode imaging, spectral Doppler, color Doppler, and power Doppler as needed of all accessible portions of each vessel. Bilateral testing is considered an integral part of a complete examination. Limited examinations for reoccurring indications may be performed as noted. The reflux portion of the exam is performed with the patient in reverse Trendelenburg.  +---------+---------------+---------+-----------+----------+--------------+ RIGHT    CompressibilityPhasicitySpontaneityPropertiesThrombus Aging +---------+---------------+---------+-----------+----------+--------------+ CFV      Full           Yes      Yes                                 +---------+---------------+---------+-----------+----------+--------------+ SFJ      Full                                                        +---------+---------------+---------+-----------+----------+--------------+ FV Prox  Full                                                        +---------+---------------+---------+-----------+----------+--------------+ FV Mid   Full                                                        +---------+---------------+---------+-----------+----------+--------------+ FV DistalFull                                                        +---------+---------------+---------+-----------+----------+--------------+ PFV      Full                                                        +---------+---------------+---------+-----------+----------+--------------+ POP      Full           Yes       Yes                                 +---------+---------------+---------+-----------+----------+--------------+ PTV      Full                                                        +---------+---------------+---------+-----------+----------+--------------+ PERO     Full                                                        +---------+---------------+---------+-----------+----------+--------------+   +---------+---------------+---------+-----------+----------+--------------+  LEFT     CompressibilityPhasicitySpontaneityPropertiesThrombus Aging +---------+---------------+---------+-----------+----------+--------------+ CFV      Full           Yes      Yes                                 +---------+---------------+---------+-----------+----------+--------------+ SFJ      Full                                                        +---------+---------------+---------+-----------+----------+--------------+ FV Prox  Full                                                        +---------+---------------+---------+-----------+----------+--------------+ FV Mid   Full                                                        +---------+---------------+---------+-----------+----------+--------------+ FV DistalFull                                                        +---------+---------------+---------+-----------+----------+--------------+ PFV      Full                                                        +---------+---------------+---------+-----------+----------+--------------+ POP      Full           Yes      Yes                                 +---------+---------------+---------+-----------+----------+--------------+ PTV      Full                                                        +---------+---------------+---------+-----------+----------+--------------+ PERO     Full                                                         +---------+---------------+---------+-----------+----------+--------------+     Summary: BILATERAL: - No evidence of deep vein thrombosis seen in the lower extremities, bilaterally. - No evidence of superficial venous thrombosis in the lower extremities, bilaterally. -   *See table(s) above for measurements and observations.    Preliminary  Procedures Procedures (including critical care time)  Medications Ordered in ED Medications - No data to display  ED Course  I have reviewed the triage vital signs and the nursing notes.  Pertinent labs & imaging results that were available during my care of the patient were reviewed by me and considered in my medical decision making (see chart for details).    MDM Rules/Calculators/A&P                          Lower extremity pain-patient has normal lower extremity exam with the exception of some greater circumference of the calf on the right versus the left.  Pulses are intact.  Patient is able to move without difficulty and has good and equal strength.  Patient had ABIs done last week that are normal.  DVTs done today show no evidence of DVT in lower extremities bilaterally Chest pain-patient with normal EKG here.  X-Rupa Lagan is clear.  He has some associated dyspnea.  Plan CTA of chest. Some right upper quadrant pain.  This appears to be food related.  She is nontender on exam here.  This has been going on for some time.  Patient pain could represent gallbladder colic no evidence of that on her evaluation here in the ED currently. Discussed with Dr. Darl Householder and he will follow up Final Clinical Impression(s) / ED Diagnoses Final diagnoses:  Pain in both lower extremities  Chest pain, unspecified type  Deep vein thrombosis (DVT) during current hospitalization [facility])    Rx / DC Orders ED Discharge Orders    None       Pattricia Boss, MD 03/31/20 1529

## 2020-03-31 NOTE — ED Provider Notes (Signed)
c Physical Exam  BP 127/78   Pulse 70   Temp 97.7 F (36.5 C) (Oral)   Resp 18   SpO2 100%   Physical Exam  ED Course/Procedures     Procedures  MDM  Patient care assumed at 3 PM.  Patient is here with shortness of breath as well as bilateral leg pain.  DVT study is negative and CTA pending  4:51 PM CTA did not show any PE.  Patient has a primary care doctor and been talking with her primary care doctor about her shortness of breath and restless leg.  She is stable for discharge at this point.     Drenda Freeze, MD 03/31/20 [phone]

## 2020-03-31 NOTE — ED Triage Notes (Signed)
Pt reports bilateral leg pain for 3 weeks and sob for 3 months. Pt has hx of blood clot in right leg in Jan, no longer taking a blood thinner. Pt sent here by PCP. Resp e.u, intermittent chest pain

## 2020-04-08 ENCOUNTER — Encounter: Payer: Self-pay | Admitting: Family Medicine

## 2020-04-11 ENCOUNTER — Other Ambulatory Visit: Payer: Self-pay | Admitting: Family Medicine

## 2020-04-11 DIAGNOSIS — M791 Myalgia, unspecified site: Secondary | ICD-10-CM

## 2020-04-11 DIAGNOSIS — F419 Anxiety disorder, unspecified: Secondary | ICD-10-CM

## 2020-04-11 MED ORDER — DULOXETINE HCL 30 MG PO CPEP: 30.0000 mg | ORAL_CAPSULE | Freq: Every day | ORAL | 1 refills | Status: DC

## 2020-04-12 ENCOUNTER — Ambulatory Visit: Payer: Managed Care, Other (non HMO) | Admitting: Physician Assistant

## 2020-05-01 ENCOUNTER — Other Ambulatory Visit: Payer: Self-pay | Admitting: Family Medicine

## 2020-05-04 ENCOUNTER — Encounter: Payer: Managed Care, Other (non HMO) | Admitting: Neurology

## 2020-07-10 ENCOUNTER — Other Ambulatory Visit: Payer: Self-pay | Admitting: Family Medicine

## 2020-07-11 ENCOUNTER — Ambulatory Visit: Payer: Managed Care, Other (non HMO) | Admitting: Family Medicine

## 2020-07-13 ENCOUNTER — Other Ambulatory Visit: Payer: Self-pay | Admitting: Family Medicine

## 2020-07-13 DIAGNOSIS — F419 Anxiety disorder, unspecified: Secondary | ICD-10-CM

## 2020-07-13 DIAGNOSIS — M791 Myalgia, unspecified site: Secondary | ICD-10-CM

## 2020-07-27 ENCOUNTER — Encounter: Payer: Managed Care, Other (non HMO) | Admitting: Family Medicine

## 2020-08-02 ENCOUNTER — Other Ambulatory Visit: Payer: Self-pay | Admitting: Family Medicine

## 2020-08-02 ENCOUNTER — Ambulatory Visit (INDEPENDENT_AMBULATORY_CARE_PROVIDER_SITE_OTHER): Payer: Managed Care, Other (non HMO) | Admitting: Family Medicine

## 2020-08-02 ENCOUNTER — Encounter: Payer: Self-pay | Admitting: Family Medicine

## 2020-08-02 ENCOUNTER — Other Ambulatory Visit: Payer: Self-pay

## 2020-08-02 VITALS — BP 128/70 | HR 88 | Temp 98.5°F | Ht 66.0 in | Wt 210.0 lb

## 2020-08-02 DIAGNOSIS — Z130 Encounter for screening for diseases of the blood and blood-forming organs and certain disorders involving the immune mechanism: Secondary | ICD-10-CM | POA: Diagnosis not present

## 2020-08-02 DIAGNOSIS — Z13228 Encounter for screening for other metabolic disorders: Secondary | ICD-10-CM | POA: Diagnosis not present

## 2020-08-02 DIAGNOSIS — Z1211 Encounter for screening for malignant neoplasm of colon: Secondary | ICD-10-CM | POA: Diagnosis not present

## 2020-08-02 DIAGNOSIS — E559 Vitamin D deficiency, unspecified: Secondary | ICD-10-CM | POA: Diagnosis not present

## 2020-08-02 DIAGNOSIS — Z1159 Encounter for screening for other viral diseases: Secondary | ICD-10-CM | POA: Diagnosis not present

## 2020-08-02 DIAGNOSIS — G43109 Migraine with aura, not intractable, without status migrainosus: Secondary | ICD-10-CM | POA: Diagnosis not present

## 2020-08-02 DIAGNOSIS — E785 Hyperlipidemia, unspecified: Secondary | ICD-10-CM

## 2020-08-02 DIAGNOSIS — Z1329 Encounter for screening for other suspected endocrine disorder: Secondary | ICD-10-CM | POA: Diagnosis not present

## 2020-08-02 DIAGNOSIS — Z0000 Encounter for general adult medical examination without abnormal findings: Secondary | ICD-10-CM

## 2020-08-02 LAB — COMPREHENSIVE METABOLIC PANEL
ALT: 20 U/L (ref 0–35)
AST: 18 U/L (ref 0–37)
Albumin: 4.1 g/dL (ref 3.5–5.2)
Alkaline Phosphatase: 57 U/L (ref 39–117)
BUN: 11 mg/dL (ref 6–23)
CO2: 28 mEq/L (ref 19–32)
Calcium: 9.9 mg/dL (ref 8.4–10.5)
Chloride: 105 mEq/L (ref 96–112)
Creatinine, Ser: 0.9 mg/dL (ref 0.40–1.20)
GFR: 75.22 mL/min (ref >60.00)
Glucose, Bld: 92 mg/dL (ref 70–99)
Potassium: 4 mEq/L (ref 3.5–5.1)
Sodium: 139 mEq/L (ref 135–145)
Total Bilirubin: 0.4 mg/dL (ref 0.2–1.2)
Total Protein: 7.2 g/dL (ref 6.0–8.3)

## 2020-08-02 LAB — LIPID PANEL
Cholesterol: 236 mg/dL — ABNORMAL HIGH (ref 0–200)
HDL: 54.8 mg/dL (ref >39.00)
LDL Cholesterol: 157 mg/dL — ABNORMAL HIGH (ref 0–99)
NonHDL: 180.95
Total CHOL/HDL Ratio: 4
Triglycerides: 121 mg/dL (ref 0.0–149.0)
VLDL: 24.2 mg/dL (ref 0.0–40.0)

## 2020-08-02 LAB — HEMOGLOBIN A1C: Hgb A1c MFr Bld: 5.5 % (ref 4.6–6.5)

## 2020-08-02 LAB — VITAMIN D 25 HYDROXY (VIT D DEFICIENCY, FRACTURES): VITD: 34 ng/mL (ref 30.00–100.00)

## 2020-08-02 MED ORDER — TOPIRAMATE 100 MG PO TABS: 150.0000 mg | ORAL_TABLET | Freq: Every day | ORAL | 0 refills | Status: DC

## 2020-08-02 NOTE — Patient Instructions (Addendum)
Today you have you routine preventive visit. A few things to remember from today's visit:  Encounter for HCV screening test for low risk patient - Plan: Hepatitis C antibody screen  Hyperlipidemia, unspecified hyperlipidemia type - Plan: Lipid panel  Vitamin D deficiency, unspecified - Plan: VITAMIN D 25 Hydroxy (Vit-D Deficiency, Fractures)  Routine general medical examination at a health care facility  Colon cancer screening - Plan: Ambulatory referral to Gastroenterology  Screening for endocrine, metabolic and immunity disorder - Plan: Hemoglobin A1c, Comprehensive metabolic panel  Migraine with aura and without status migrainosus, not intractable  If you need refills please call your pharmacy. Do not use My Chart to request refills or for acute issues that need immediate attention.   Today we increased dose of Topamax from 100 mg to 150 mg. Please let me know in 6-8 weeks if it is helping. Keep a headache diary.  Please be sure medication list is accurate. If a new problem present, please set up appointment sooner than planned today.  At least 150 minutes of moderate exercise per week, daily brisk walking for 15-30 min is a good exercise option. Healthy diet low in saturated (animal) fats and sweets and consisting of fresh fruits and vegetables, lean meats such as fish and white chicken and whole grains.  These are some of recommendations for screening depending of age and risk factors:  - Vaccines:  Tdap vaccine every 10 years.  Shingles vaccine recommended at age 78, could be given after 50 years of age but not sure about insurance coverage.   Pneumonia vaccines: Pneumovax at 35. Sometimes Pneumovax is giving earlier if history of smoking, lung disease,diabetes,kidney disease among some.  Screening for diabetes at age 19 and every 3 years.  Cervical cancer prevention:  Pap smear starts at 50 years of age and continues periodically until 50 years old in low risk  women. Pap smear every 3 years between 49 and 62 years old. Pap smear every 3-5 years between women 47 and older if pap smear negative and HPV screening negative.   -Breast cancer: Mammogram: There is disagreement between experts about when to start screening in low risk asymptomatic female but recent recommendations are to start screening at 15 and not later than 50 years old , every 1-2 years and after 50 yo q 2 years. Screening is recommended until 50 years old but some women can continue screening depending of healthy issues.  Colon cancer screening: Has been recently changed to 50 yo. Insurance may not cover until you are 50 years old. Screening is recommended until 50 years old.  Cholesterol disorder screening at age 47 and every 3 years. N/A  Also recommended:  1. Dental visit- Brush and floss your teeth twice daily; visit your dentist twice a year. 2. Eye doctor- Get an eye exam at least every 2 years. 3. Helmet use- Always wear a helmet when riding a bicycle, motorcycle, rollerblading or skateboarding. 4. Safe sex- If you may be exposed to sexually transmitted infections, use a condom. 5. Seat belts- Seat belts can save your live; always wear one. 6. Smoke/Carbon Monoxide detectors- These detectors need to be installed on the appropriate level of your home. Replace batteries at least once a year. 7. Skin cancer- When out in the sun please cover up and use sunscreen 15 SPF or higher. 8. Violence- If anyone is threatening or hurting you, please tell your healthcare provider.  9. Drink alcohol in moderation- Limit alcohol intake to one drink or less  per day. Never drink and drive. 10. Calcium supplementation 1000 to 1200 mg daily, ideally through your diet.  Vitamin D supplementation 800 units daily.

## 2020-08-02 NOTE — Progress Notes (Unsigned)
HPI: Jeanne Payne is a 50 y.o. female, who is here today for her routine physical.  Last CPE: 05/2019.  Regular exercise 3 or more time per week: Not consistently. Following a healthy diet: "Sometimes", not consistently. She is cooking at home. She eats fried food sometimes. She lives with her husband.  Chronic medical problems: Migraine headaches,HLD, hx of DVT x 1,vit D deficiency,GERD,anxiety,and chronic fatigue among some.  Pap smear: 01/2020 Follows with gyn, Marny Lowenstein, NP.  Immunization History  Administered Date(s) Administered  . Influenza-Unspecified 05/27/2015  . Tdap 04/16/2013   Mammogram: 02/17/2020. Colonoscopy: Never. DEXA: N/A  Hep C screening: Never.  Concerns today:  Upper back pain,numbness/tingling,and myalgias. Stopped Cymbalta because aggravated constipation. She does not think medication helped. UE and LE muscles aches, intermittent.  She has seen neuro for numbness and tingling.  HLD: She is not taking Atorvastatin 40 mg consistently.  Lab Results  Component Value Date   CHOL 211 (H) 10/20/2019   HDL 44.20 10/20/2019   LDLCALC 133 (H) 10/20/2019   TRIG 170.0 (H) 10/20/2019   CHOLHDL 5 10/20/2019   Vit D deficiency: She states Vit D supplementation, 2000 U 2 tabs daily. Leg aches seem better.  Migraine: Having daily headaches. Sometimes associated nausea.  She is not longer having photophobia or visual aura. Migraine/headache can last up to 4-5 days on and off. Episodes are mild and not debilitating.  She has seen neuro before. Menstrual periods aggravate problem.  She takes Topamax 100 mg daily and Imitrex daily as needed.  Review of Systems  Constitutional: Positive for fatigue. Negative for appetite change and fever.  HENT: Negative for hearing loss, mouth sores, sore throat, trouble swallowing and voice change.   Eyes: Negative for redness and visual disturbance.  Respiratory: Negative for cough,  shortness of breath and wheezing.   Cardiovascular: Negative for chest pain and leg swelling.  Gastrointestinal: Negative for abdominal pain, nausea and vomiting.       No changes in bowel habits.  Endocrine: Negative for cold intolerance, heat intolerance, polydipsia, polyphagia and polyuria.  Genitourinary: Negative for decreased urine volume, dysuria, hematuria, vaginal bleeding and vaginal discharge.  Musculoskeletal: Positive for back pain and myalgias. Negative for gait problem.  Skin: Negative for color change and rash.  Allergic/Immunologic: Negative for environmental allergies.  Neurological: Negative for syncope, facial asymmetry and weakness.  Hematological: Negative for adenopathy. Does not bruise/bleed easily.  Psychiatric/Behavioral: Negative for confusion. The patient is nervous/anxious.   All other systems reviewed and are negative.  Current Outpatient Medications on File Prior to Visit  Medication Sig Dispense Refill  . atorvastatin (LIPITOR) 40 MG tablet Take 40 mg by mouth daily.    Marland Kitchen esomeprazole (NEXIUM) 40 MG capsule TAKE 1 CAPSULE(40 MG) BY MOUTH DAILY (Patient taking differently: Take 40 mg by mouth in the morning.) 30 capsule 3  . promethazine (PHENERGAN) 25 MG tablet Take 25 mg by mouth every 6 (six) hours as needed for nausea or vomiting.     No current facility-administered medications on file prior to visit.   Past Medical History:  Diagnosis Date  . Blood clot in vein    Blood Clot in right leg  . Depression   . Hyperlipidemia   . Migraine    Past Surgical History:  Procedure Laterality Date  . TUBAL LIGATION     Allergies  Allergen Reactions  . Benadryl [Diphenhydramine]     Restless legs    Family History  Problem Relation Age of  Onset  . Hypertension Mother   . Depression Mother   . Alcohol abuse Father   . Asthma Father   . Cancer Father   . COPD Father   . Diabetes Father   . Hearing loss Father   . Heart disease Father   .  Depression Sister   . Early death Sister   . Alcohol abuse Brother   . Drug abuse Brother   . Arthritis Paternal Grandmother   . COPD Paternal Grandmother   . Hearing loss Paternal Grandmother   . Breast cancer Paternal Grandmother     Social History   Socioeconomic History  . Marital status: Married    Spouse name: Not on file  . Number of children: Not on file  . Years of education: Not on file  . Highest education level: Not on file  Occupational History  . Not on file  Tobacco Use  . Smoking status: Former Research scientist (life sciences)  . Smokeless tobacco: Never Used  Substance and Sexual Activity  . Alcohol use: No  . Drug use: Never  . Sexual activity: Yes    Birth control/protection: None  Other Topics Concern  . Not on file  Social History Narrative   ** Merged History Encounter **    Right Handed   Lives in a one story home   Social Determinants of Health   Financial Resource Strain: Not on file  Food Insecurity: Not on file  Transportation Needs: Not on file  Physical Activity: Not on file  Stress: Not on file  Social Connections: Not on file   Vitals:   08/02/20 1000  BP: 128/70  Pulse: 88  Temp: 98.5 F (36.9 C)  SpO2: 97%   Body mass index is 33.89 kg/m.  Wt Readings from Last 3 Encounters:  08/02/20 210 lb (95.3 kg)  03/25/20 202 lb (91.6 kg)  03/14/20 204 lb 8 oz (92.8 kg)   Physical Exam Vitals and nursing note reviewed.  Constitutional:      General: She is not in acute distress.    Appearance: She is well-developed.  HENT:     Head: Normocephalic and atraumatic.     Right Ear: Hearing, tympanic membrane, ear canal and external ear normal.     Left Ear: Hearing, tympanic membrane, ear canal and external ear normal.     Mouth/Throat:     Mouth: Oropharynx is clear and moist and mucous membranes are normal.     Pharynx: Uvula midline.  Eyes:     Extraocular Movements: EOM normal.     Conjunctiva/sclera: Conjunctivae normal.     Pupils: Pupils are  equal, round, and reactive to light.  Neck:     Thyroid: No thyromegaly.     Trachea: No tracheal deviation.  Cardiovascular:     Rate and Rhythm: Normal rate and regular rhythm.     Pulses:          Dorsalis pedis pulses are 2+ on the right side and 2+ on the left side.     Heart sounds: No murmur heard.   Pulmonary:     Effort: Pulmonary effort is normal. No respiratory distress.     Breath sounds: Normal breath sounds.  Chest:  Breasts:     Right: No supraclavicular adenopathy.     Left: No supraclavicular adenopathy.    Abdominal:     Palpations: Abdomen is soft. There is no hepatomegaly or mass.     Tenderness: There is no abdominal tenderness.  Genitourinary:  Comments: Deferred to gyn. Musculoskeletal:        General: No edema.     Comments: No major deformity or signs of synovitis appreciated.  Lymphadenopathy:     Cervical: No cervical adenopathy.     Upper Body:     Right upper body: No supraclavicular adenopathy.     Left upper body: No supraclavicular adenopathy.  Skin:    General: Skin is warm.     Findings: No erythema or rash.  Neurological:     Mental Status: She is alert and oriented to person, place, and time.     Cranial Nerves: No cranial nerve deficit.     Coordination: Coordination normal.     Gait: Gait normal.     Deep Tendon Reflexes: Strength normal.     Reflex Scores:      Bicep reflexes are 2+ on the right side and 2+ on the left side.      Patellar reflexes are 2+ on the right side and 2+ on the left side. Psychiatric:        Mood and Affect: Mood and affect normal.        Speech: Speech normal.     Comments: Well groomed, good eye contact.   ASSESSMENT AND PLAN:  Ms. Mickaela Starlin was here today annual physical examination.  Orders Placed This Encounter  Procedures  . VITAMIN D 25 Hydroxy (Vit-D Deficiency, Fractures)  . Hemoglobin A1c  . Comprehensive metabolic panel  . Lipid panel  . Hepatitis C antibody  screen  . Ambulatory referral to Gastroenterology   Lab Results  Component Value Date   HGBA1C 5.5 08/02/2020   Lab Results  Component Value Date   CREATININE 0.90 08/02/2020   BUN 11 08/02/2020   NA 139 08/02/2020   K 4.0 08/02/2020   CL 105 08/02/2020   CO2 28 08/02/2020   Lab Results  Component Value Date   ALT 20 08/02/2020   AST 18 08/02/2020   ALKPHOS 57 08/02/2020   BILITOT 0.4 08/02/2020   Lab Results  Component Value Date   CHOL 236 (H) 08/02/2020   HDL 54.80 08/02/2020   LDLCALC 157 (H) 08/02/2020   TRIG 121.0 08/02/2020   CHOLHDL 4 08/02/2020   Routine general medical examination at a health care facility She understands the importance of regular physical activity and healthy diet for prevention of chronic illness and/or complications. Preventive guidelines reviewed. Vaccination up to date. Continue following with gyn for her female preventive care. Next CPE in a year.  The 10-year ASCVD risk score Mikey Bussing DC Brooke Bonito., et al., 2013) is: 1.4%   Values used to calculate the score:     Age: 26 years     Sex: Female     Is Non-Hispanic African American: No     Diabetic: No     Tobacco smoker: No     Systolic Blood Pressure: 536 mmHg     Is BP treated: No     HDL Cholesterol: 54.8 mg/dL     Total Cholesterol: 236 mg/dL  Encounter for HCV screening test for low risk patient -     Hepatitis C antibody screen  Hyperlipidemia, unspecified hyperlipidemia type Continue non pharmacologic treatment. Further recommendations according to FLP results.  Vitamin D deficiency, unspecified Continue Vit D 4000 U daily. Further recommendations according to 25 [place] vit D result.  Colon cancer screening -     Ambulatory referral to Gastroenterology  Screening for endocrine, metabolic and immunity disorder -  Comprehensive metabolic panel -     Hemoglobin A1c  Migraine with aura and without status migrainosus, not intractable We discussed other differential diagnosis,  ? Tension headache. We discussed a few treatment options, she would like to try higher doses of Topamax, so increase it from 100 mg to 150 mg. No changes in imitrex. If problem does not improve neuro evaluation can be arranged to discussed new therapies.  -     topiramate (TOPAMAX) 100 MG tablet; Take 1.5 tablets (150 mg total) by mouth at bedtime.  Return in 1 year (on 08/02/2021) for cpe, before if needed..   Tersa Fotopoulos G. Martinique, MD  [facility]. [place] office.  Today you have you routine preventive visit. A few things to remember from today's visit:  Encounter for HCV screening test for low risk patient - Plan: Hepatitis C antibody screen  Hyperlipidemia, unspecified hyperlipidemia type - Plan: Lipid panel  Vitamin D deficiency, unspecified - Plan: VITAMIN D 25 Hydroxy (Vit-D Deficiency, Fractures)  Routine general medical examination at a health care facility  Colon cancer screening - Plan: Ambulatory referral to Gastroenterology  Screening for endocrine, metabolic and immunity disorder - Plan: Hemoglobin A1c, Comprehensive metabolic panel  Migraine with aura and without status migrainosus, not intractable  If you need refills please call your pharmacy. Do not use My Chart to request refills or for acute issues that need immediate attention.   Today we increased dose of Topamax from 100 mg to 150 mg. Please let me know in 6-8 weeks if it is helping. Keep a headache diary.  Please be sure medication list is accurate. If a new problem present, please set up appointment sooner than planned today.  At least 150 minutes of moderate exercise per week, daily brisk walking for 15-30 min is a good exercise option. Healthy diet low in saturated (animal) fats and sweets and consisting of fresh fruits and vegetables, lean meats such as fish and white chicken and whole grains.  These are some of recommendations for screening depending of age and risk factors:  -  Vaccines:  Tdap vaccine every 10 years.  Shingles vaccine recommended at age 34, could be given after 50 years of age but not sure about insurance coverage.   Pneumonia vaccines: Pneumovax at 23. Sometimes Pneumovax is giving earlier if history of smoking, lung disease,diabetes,kidney disease among some.  Screening for diabetes at age 37 and every 3 years.  Cervical cancer prevention:  Pap smear starts at 50 years of age and continues periodically until 50 years old in low risk women. Pap smear every 3 years between 56 and 45 years old. Pap smear every 3-5 years between women 76 and older if pap smear negative and HPV screening negative.   -Breast cancer: Mammogram: There is disagreement between experts about when to start screening in low risk asymptomatic female but recent recommendations are to start screening at 56 and not later than 50 years old , every 1-2 years and after 50 yo q 2 years. Screening is recommended until 50 years old but some women can continue screening depending of healthy issues.  Colon cancer screening: Has been recently changed to 50 yo. Insurance may not cover until you are 50 years old. Screening is recommended until 50 years old.  Cholesterol disorder screening at age 67 and every 3 years. N/A  Also recommended:  1. Dental visit- Brush and floss your teeth twice daily; visit your dentist twice a year. 2. Eye doctor- Get an eye exam  at least every 2 years. 3. Helmet use- Always wear a helmet when riding a bicycle, motorcycle, rollerblading or skateboarding. 4. Safe sex- If you may be exposed to sexually transmitted infections, use a condom. 5. Seat belts- Seat belts can save your live; always wear one. 6. Smoke/Carbon Monoxide detectors- These detectors need to be installed on the appropriate level of your home. Replace batteries at least once a year. 7. Skin cancer- When out in the sun please cover up and use sunscreen 15 SPF or higher. 8. Violence- If  anyone is threatening or hurting you, please tell your healthcare provider.  9. Drink alcohol in moderation- Limit alcohol intake to one drink or less per day. Never drink and drive. 10. Calcium supplementation 1000 to 1200 mg daily, ideally through your diet.  Vitamin D supplementation 800 units daily.

## 2020-08-03 LAB — HEPATITIS C ANTIBODY
Hepatitis C Ab: NONREACTIVE
SIGNAL TO CUT-OFF: 0.01 (ref <1.00)

## 2020-08-10 ENCOUNTER — Encounter: Payer: Self-pay | Admitting: Physician Assistant

## 2020-08-10 ENCOUNTER — Encounter: Payer: Self-pay | Admitting: Family Medicine

## 2020-08-12 ENCOUNTER — Other Ambulatory Visit: Payer: Self-pay | Admitting: Family Medicine

## 2020-08-12 ENCOUNTER — Encounter: Payer: Self-pay | Admitting: Physician Assistant

## 2020-08-12 DIAGNOSIS — G43109 Migraine with aura, not intractable, without status migrainosus: Secondary | ICD-10-CM

## 2020-08-25 ENCOUNTER — Encounter: Payer: Self-pay | Admitting: Physician Assistant

## 2020-08-25 ENCOUNTER — Ambulatory Visit: Payer: Managed Care, Other (non HMO) | Admitting: Physician Assistant

## 2020-08-25 ENCOUNTER — Other Ambulatory Visit: Payer: Self-pay

## 2020-08-25 VITALS — BP 104/74 | HR 84 | Ht 66.0 in | Wt 205.4 lb

## 2020-08-25 DIAGNOSIS — R1013 Epigastric pain: Secondary | ICD-10-CM

## 2020-08-25 DIAGNOSIS — Z1211 Encounter for screening for malignant neoplasm of colon: Secondary | ICD-10-CM | POA: Diagnosis not present

## 2020-08-25 DIAGNOSIS — R0789 Other chest pain: Secondary | ICD-10-CM | POA: Diagnosis not present

## 2020-08-25 DIAGNOSIS — K219 Gastro-esophageal reflux disease without esophagitis: Secondary | ICD-10-CM | POA: Diagnosis not present

## 2020-08-25 MED ORDER — PLENVU 140 G PO SOLR: 1.0000 | ORAL | 0 refills | Status: DC

## 2020-08-25 MED ORDER — ESOMEPRAZOLE MAGNESIUM 40 MG PO CPDR: 40.0000 mg | DELAYED_RELEASE_CAPSULE | Freq: Two times a day (BID) | ORAL | 5 refills | Status: DC

## 2020-08-25 NOTE — Progress Notes (Signed)
Chief Complaint: GERD, abdominal pain  HPI:    Jeanne Payne is a 50 year old female with a past medical history as listed below, who was referred to me by Martinique, Betty G, MD for a complaint of GERD and abdominal pain.      03/31/2020 CT angio chest with visualized upper abdomen "unremarkable".    Today, the patient tells me that since 2019 she has had some symptoms of what she thinks is reflux.  Initially presented as chest tightness and pain and she had a stress test which was normal.  Her PCP started her on Nexium 40 mg about a year ago in January and this helped really almost completely resolve her symptoms until April when she started getting breakthroughs in the night, since then she has continued with symptoms off and on.  At one point she stopped her Nexium because she felt like it was not helping at all and "that was a terrible mistake".  Describes that she will get chest tightness and epigastric discomfort which radiates up into her esophagus and even sometimes her right shoulder blade and right side of her chest.  Tells me that after she gets an "attack", she will be sore just all over.  Laying down to sleep at night makes things worse.  Lately she has noticed that even her throat is becoming sore and "scratchy" all the time.  Currently she is only using her Nexium 40 about every other day and is trying just not to eat which seems to help.    Admits to about a 60 pound weight gain over the past 3 years or so and does feel like this has worsened her symptoms.  Having trouble losing weight.    Denies fever, chills, weight loss or symptoms that awaken her from sleep.  Past Medical History:  Diagnosis Date  . Blood clot in vein    Blood Clot in right leg  . Depression   . Hyperlipidemia   . Migraine     Past Surgical History:  Procedure Laterality Date  . TUBAL LIGATION      Current Outpatient Medications  Medication Sig Dispense Refill  . escitalopram (LEXAPRO) 20 MG tablet  Take 20 mg by mouth daily.    Marland Kitchen esomeprazole (NEXIUM) 40 MG capsule Take 40 mg by mouth every other day.    . SUMAtriptan (IMITREX) 100 MG tablet TAKE 1 TABLET(100 MG) BY MOUTH DAILY AS NEEDED FOR MIGRAINE 10 tablet 3  . topiramate (TOPAMAX) 100 MG tablet Take 50 mg by mouth daily.     No current facility-administered medications for this visit.    Allergies as of 08/25/2020 - Review Complete 08/25/2020  Allergen Reaction Noted  . Benadryl [diphenhydramine]  03/25/2020    Family History  Problem Relation Age of Onset  . Hypertension Mother   . Depression Mother   . Alcohol abuse Father   . Asthma Father   . Cancer Father   . COPD Father   . Diabetes Father   . Hearing loss Father   . Heart disease Father   . Depression Sister   . Early death Sister   . Alcohol abuse Brother   . Drug abuse Brother   . Arthritis Paternal Grandmother   . COPD Paternal Grandmother   . Hearing loss Paternal Grandmother   . Breast cancer Paternal Grandmother     Social History   Socioeconomic History  . Marital status: Married    Spouse name: Not on file  . Number  of children: Not on file  . Years of education: Not on file  . Highest education level: Not on file  Occupational History  . Not on file  Tobacco Use  . Smoking status: Former Research scientist (life sciences)  . Smokeless tobacco: Never Used  Substance and Sexual Activity  . Alcohol use: No  . Drug use: Never  . Sexual activity: Yes    Birth control/protection: None  Other Topics Concern  . Not on file  Social History Narrative   ** Merged History Encounter **    Right Handed   Lives in a one story home   Social Determinants of Health   Financial Resource Strain: Not on file  Food Insecurity: Not on file  Transportation Needs: Not on file  Physical Activity: Not on file  Stress: Not on file  Social Connections: Not on file  Intimate Partner Violence: Not on file    Review of Systems:    Constitutional: No weight loss, fever or  chills Skin: No rash  Cardiovascular: No chest pain Respiratory: No SOB  Gastrointestinal: See HPI and otherwise negative Genitourinary: No dysuria  Neurological: No headache, dizziness or syncope Musculoskeletal: No new muscle or joint pain Hematologic: No bleeding  Psychiatric: No history of depression or anxiety   Physical Exam:  Vital signs: BP 104/74 (BP Location: Left Arm, Patient Position: Sitting, Cuff Size: Normal)   Pulse 84   Ht 5\' 6"  (1.676 m)   Wt 205 lb 6 oz (93.2 kg)   BMI 33.15 kg/m   Constitutional:   Pleasant overweight Caucasian female appears to be in NAD, Well developed, Well nourished, alert and cooperative Head:  Normocephalic and atraumatic. Eyes:   PEERL, EOMI. No icterus. Conjunctiva pink. Ears:  Normal auditory acuity. Neck:  Supple Throat: Oral cavity and pharynx without inflammation, swelling or lesion.  Respiratory: Respirations even and unlabored. Lungs clear to auscultation bilaterally.   No wheezes, crackles, or rhonchi.  Cardiovascular: Normal S1, S2. No MRG. Regular rate and rhythm. No peripheral edema, cyanosis or pallor.  Gastrointestinal:  Soft, nondistended, moderate epigastric TTP with involuntary guarding. Normal bowel sounds. No appreciable masses or hepatomegaly. Rectal:  Not performed.  Msk:  Symmetrical without gross deformities. Without edema, no deformity or joint abnormality. +ttp over right sided lower ribs Neurologic:  Alert and  oriented x4;  grossly normal neurologically.  Skin:   Dry and intact without significant lesions or rashes. Psychiatric:  Demonstrates good judgement and reason without abnormal affect or behaviors.  RELEVANT LABS AND IMAGING: CBC    Component Value Date/Time   WBC 6.2 03/31/2020 1128   RBC 4.87 03/31/2020 1128   HGB 14.3 03/31/2020 1128   HCT 44.0 03/31/2020 1128   PLT 259 03/31/2020 1128   MCV 90.3 03/31/2020 1128   MCH 29.4 03/31/2020 1128   MCHC 32.5 03/31/2020 1128   RDW 13.2 03/31/2020 1128     CMP     Component Value Date/Time   NA 139 08/02/2020 1108   K 4.0 08/02/2020 1108   CL 105 08/02/2020 1108   CO2 28 08/02/2020 1108   GLUCOSE 92 08/02/2020 1108   BUN 11 08/02/2020 1108   CREATININE 0.90 08/02/2020 1108   CALCIUM 9.9 08/02/2020 1108   PROT 7.2 08/02/2020 1108   ALBUMIN 4.1 08/02/2020 1108   AST 18 08/02/2020 1108   ALT 20 08/02/2020 1108   ALKPHOS 57 08/02/2020 1108   BILITOT 0.4 08/02/2020 1108   GFRNONAA >60 03/31/2020 1128   GFRAA >60 07/21/2019 1632  Assessment: 1.  GERD: Uncontrolled on Nexium 40 mg daily over the past couple of years with atypical chest and back pain; consider relation to weight gain+/-diet+/-gastritis 2.  Atypical chest pain: With above 3.  Screening for colorectal cancer: Patient is 24 and never had screening for colon cancer  Plan: 1.  Scheduled patient for screening colonoscopy and diagnostic EGD in the [place] with Dr. Hilarie Fredrickson.  Patient was provided with a detailed list of risks for the procedure and she agrees to proceed. 2.  Increased patient's Nexium to 40 mg twice daily, 30-60 minutes before breakfast and dinner #60 with 5 refills. 3.  Patient questions gallbladder issues, explained to her that if we do procedures above and/or medicine does not work then we can further evaluate gallbladder, though she did just have a CT as in HPI and they did not mention any problems with the gallbladder.  She does need further work-up would recommend a right upper quadrant ultrasound. 4.  Discussed right-sided chest tenderness today.  Apparently she has had a mammogram recently which was normal but continues with this tenderness "only if I push on it".  Recommended heating pads, discussed this could be related to her weight gain and the breast tissue pulling in this area. 5.  Discussed weight loss, recommended weight watchers or similar program or a dietitian referral. 6.  Patient to follow in clinic per recommendations after  procedures.  Ellouise Newer, PA-C [facility] 08/25/2020, 9:25 AM  Cc: Martinique, Betty G, MD

## 2020-08-25 NOTE — Patient Instructions (Addendum)
You have been scheduled for an endoscopy and colonoscopy. Please follow the written instructions given to you at your visit today. Please pick up your prep supplies at the pharmacy within the next 1-3 days. If you use inhalers (even only as needed), please bring them with you on the day of your procedure.   Due to recent changes in healthcare laws, you may see the results of your imaging and laboratory studies on MyChart before your provider has had a chance to review them.  We understand that in some cases there may be results that are confusing or concerning to you. Not all laboratory results come back in the same time frame and the provider may be waiting for multiple results in order to interpret others.  Please give [place] 48 hours in order for your provider to thoroughly review all the results before contacting the office for clarification of your results.   We have sent the following medications to your pharmacy for you to pick up at your convenience: Nexium - she is increasing your dosage to twice daily  We have sent the following medications to your pharmacy for you to pick up at your convenience: Nexium  Try using a heating pad on your right side.  I appreciate the opportunity to care for you. Ellouise Newer, PA-C

## 2020-08-26 NOTE — Progress Notes (Signed)
Addendum: Reviewed and agree with assessment and management plan. Pyrtle, Jay M, MD  

## 2020-09-08 ENCOUNTER — Ambulatory Visit: Payer: Managed Care, Other (non HMO) | Admitting: Neurology

## 2020-09-08 ENCOUNTER — Other Ambulatory Visit: Payer: Self-pay

## 2020-09-08 ENCOUNTER — Encounter: Payer: Self-pay | Admitting: Neurology

## 2020-09-08 VITALS — BP 122/60 | HR 86 | Ht 66.0 in | Wt 207.2 lb

## 2020-09-08 DIAGNOSIS — G43709 Chronic migraine without aura, not intractable, without status migrainosus: Secondary | ICD-10-CM

## 2020-09-08 MED ORDER — TOPIRAMATE 25 MG PO TABS: 25.0000 mg | ORAL_TABLET | Freq: Two times a day (BID) | ORAL | 0 refills | Status: DC

## 2020-09-08 MED ORDER — NORTRIPTYLINE HCL 10 MG PO CAPS: ORAL_CAPSULE | ORAL | 3 refills | Status: DC

## 2020-09-08 NOTE — Patient Instructions (Addendum)
Reduce topiramate to 25mg  daily for 1 month, then stop.  Start nortriptyline 10mg  at bedtime for 2 week, then increase to 2 tablet at bedtime  Return to clinic in 3 months

## 2020-09-08 NOTE — Progress Notes (Signed)
Follow-up Visit   Date: 09/08/20   Jeanne Payne MRN: [id number] DOB: Jun 29, 1970   Interim History: Jeanne Payne is a 50 y.o. right-handed Caucasian female with , hyperlipidemia, migraine, and GERD  returning to the clinic with new complaints of migraines.  The patient was accompanied to the clinic by self.  She has a long history of migraines which started around the age of 33.  Her migraines would occur on the right side with associated visual aura and vomiting. She was having migraines usually once every 2-3 months, which would last 1-2 days and responsive to relpax or imitrex.  Starting around 31s, she began having migraines without visual aura.    Over the last few years, she began having migraines twice per month, lasting 3 days.  She takes imitrex which works.  The past three months, she has almost daily headaches, which tends to be bifrontal, described as a sinus headache.  She has been on topiramate since 2018 and was taking 100mg  daily and did not appreciate any benefit at 150mg , so decided to wean herself off this.   Medications:  Current Outpatient Medications on File Prior to Visit  Medication Sig Dispense Refill  . escitalopram (LEXAPRO) 20 MG tablet Take 20 mg by mouth daily.    Marland Kitchen esomeprazole (NEXIUM) 40 MG capsule Take 1 capsule (40 mg total) by mouth 2 (two) times daily before a meal. Take 30-60 mins prior to meals 60 capsule 5  . SUMAtriptan (IMITREX) 100 MG tablet TAKE 1 TABLET(100 MG) BY MOUTH DAILY AS NEEDED FOR MIGRAINE 10 tablet 3  . topiramate (TOPAMAX) 100 MG tablet Take 50 mg by mouth daily.    Marland Kitchen PEG-KCl-NaCl-NaSulf-Na Asc-C (PLENVU) 140 g SOLR Take 1 kit by mouth as directed. (Patient not taking: Reported on 09/08/2020) 1 each 0   No current facility-administered medications on file prior to visit.    Allergies:  Allergies  Allergen Reactions  . Benadryl [Diphenhydramine]     Restless legs    Vital Signs:  BP 122/60    Pulse 86   Ht 5\' 6"  (1.676 m)   Wt 207 lb 3.2 oz (94 kg)   SpO2 97%   BMI 33.44 kg/m   Neurological Exam: MENTAL STATUS including orientation to time, place, person, recent and remote memory, attention span and concentration, language, and fund of knowledge is normal.  Speech is not dysarthric.  CRANIAL NERVES:  No visual field defects.  Pupils equal round and reactive to light.  Normal conjugate, extra-ocular eye movements in all directions of gaze.  No ptosis.  Face is symmetric. Palate elevates symmetrically.  Tongue is midline.  MOTOR:  Motor strength is 5/5 in all extremities.  No atrophy, fasciculations or abnormal movements.  No pronator drift.  Tone is normal.    MSRs:  Reflexes are 2+/4 throughout.  SENSORY:  Intact to vibration and temperature throughout.  COORDINATION/GAIT:  Normal finger-to- nose-finger.  Intact rapid alternating movements bilaterally.  Gait narrow based and stable.   Data: n/a  IMPRESSION/PLAN: Chronic migraines  - previously tried:  Trokendi, topiramate (ineffective at 150mg /d)  - Taper topirmate to 25mg  daily x 1 month, then stop  - Start nortriptyline 10mg  at bedtime for 2 week, then increase to 2 tablet at bedtime  Return to clinic in 3 months   Thank you for allowing me to participate in patient's care.  If I can answer any additional questions, I would be pleased to do so.    Sincerely,  Merek Niu K. Posey Pronto, DO

## 2020-10-12 ENCOUNTER — Ambulatory Visit (AMBULATORY_SURGERY_CENTER): Payer: Managed Care, Other (non HMO) | Admitting: Internal Medicine

## 2020-10-12 ENCOUNTER — Other Ambulatory Visit: Payer: Self-pay

## 2020-10-12 ENCOUNTER — Other Ambulatory Visit: Payer: Self-pay | Admitting: Internal Medicine

## 2020-10-12 ENCOUNTER — Encounter: Payer: Self-pay | Admitting: Internal Medicine

## 2020-10-12 VITALS — BP 135/86 | HR 79 | Temp 98.6°F | Resp 13 | Ht 66.0 in | Wt 205.0 lb

## 2020-10-12 DIAGNOSIS — K449 Diaphragmatic hernia without obstruction or gangrene: Secondary | ICD-10-CM | POA: Diagnosis not present

## 2020-10-12 DIAGNOSIS — K219 Gastro-esophageal reflux disease without esophagitis: Secondary | ICD-10-CM

## 2020-10-12 DIAGNOSIS — R0789 Other chest pain: Secondary | ICD-10-CM

## 2020-10-12 DIAGNOSIS — Z1211 Encounter for screening for malignant neoplasm of colon: Secondary | ICD-10-CM

## 2020-10-12 DIAGNOSIS — K222 Esophageal obstruction: Secondary | ICD-10-CM

## 2020-10-12 DIAGNOSIS — K2970 Gastritis, unspecified, without bleeding: Secondary | ICD-10-CM | POA: Diagnosis not present

## 2020-10-12 DIAGNOSIS — K2950 Unspecified chronic gastritis without bleeding: Secondary | ICD-10-CM | POA: Diagnosis not present

## 2020-10-12 MED ORDER — SODIUM CHLORIDE 0.9 % IV SOLN: 500.0000 mL | Freq: Once | INTRAVENOUS | Status: DC

## 2020-10-12 NOTE — Patient Instructions (Signed)
Handouts given for gastritis and GERD.  YOU HAD AN ENDOSCOPIC PROCEDURE TODAY AT [facility]:   Refer to the procedure report that was given to you for any specific questions about what was found during the examination.  If the procedure report does not answer your questions, please call your gastroenterologist to clarify.  If you requested that your care partner not be given the details of your procedure findings, then the procedure report has been included in a sealed envelope for you to review at your convenience later.  YOU SHOULD EXPECT: Some feelings of bloating in the abdomen. Passage of more gas than usual.  Walking can help get rid of the air that was put into your GI tract during the procedure and reduce the bloating. If you had a lower endoscopy (such as a colonoscopy or flexible sigmoidoscopy) you may notice spotting of blood in your stool or on the toilet paper. If you underwent a bowel prep for your procedure, you may not have a normal bowel movement for a few days.  Please Note:  You might notice some irritation and congestion in your nose or some drainage.  This is from the oxygen used during your procedure.  There is no need for concern and it should clear up in a day or so.  SYMPTOMS TO REPORT IMMEDIATELY:   Following lower endoscopy (colonoscopy or flexible sigmoidoscopy):  Excessive amounts of blood in the stool  Significant tenderness or worsening of abdominal pains  Swelling of the abdomen that is new, acute  Fever of 100F or higher   Following upper endoscopy (EGD)  Vomiting of blood or coffee ground material  New chest pain or pain under the shoulder blades  Painful or persistently difficult swallowing  New shortness of breath  Black, tarry-looking stools  For urgent or emergent issues, a gastroenterologist can be reached at any hour by calling [phone]. Do not use MyChart messaging for urgent concerns.    DIET:  We do recommend a small  meal at first, but then you may proceed to your regular diet.  Drink plenty of fluids but you should avoid alcoholic beverages for 24 hours.  ACTIVITY:  You should plan to take it easy for the rest of today and you should NOT DRIVE, NO WORK or use heavy machinery until tomorrow (because of the sedation medicines used during the test).    FOLLOW UP: Our staff will call the number listed on your records Friday 4/22,  following your procedure to check on you and address any questions or concerns that you may have regarding the information given to you following your procedure. If we do not reach you, we will leave a message.  We will attempt to reach you two times.  During this call, we will ask if you have developed any symptoms of COVID 19. If you develop any symptoms (ie: fever, flu-like symptoms, shortness of breath, cough etc.) before then, please call [phone].  If you test positive for Covid 19 in the 2 weeks post procedure, please call and report this information to [place].    If any biopsies were taken you will be contacted by phone or by letter within the next 1-3 weeks.  Please call us at [phone] if you have not heard about the biopsies in 3 weeks.    SIGNATURES/CONFIDENTIALITY: You and/or your care partner have signed paperwork which will be entered into your electronic medical record.  These signatures attest to the fact that that  the information above on your After Visit Summary has been reviewed and is understood.  Full responsibility of the confidentiality of this discharge information lies with you and/or your care-partner.

## 2020-10-12 NOTE — Op Note (Signed)
[facility] Patient Name: Jeanne Payne Procedure Date: 10/12/2020 3:27 PM MRN: [id number] Endoscopist: Jerene Bears , MD Age: 50 Referring MD:  Date of Birth: January 31, 1971 Gender: Female Account #: [id number] Procedure:                Upper GI endoscopy Indications:              Gastro-esophageal reflux disease, Chest pain (non                            cardiac) Medicines:                Monitored Anesthesia Care Procedure:                Pre-Anesthesia Assessment:                           - Prior to the procedure, a History and Physical                            was performed, and patient medications and                            allergies were reviewed. The patient's tolerance of                            previous anesthesia was also reviewed. The risks                            and benefits of the procedure and the sedation                            options and risks were discussed with the patient.                            All questions were answered, and informed consent                            was obtained. Prior Anticoagulants: The patient has                            taken no previous anticoagulant or antiplatelet                            agents. ASA Grade Assessment: II - A patient with                            mild systemic disease. After reviewing the risks                            and benefits, the patient was deemed in                            satisfactory condition to undergo the procedure.  After obtaining informed consent, the endoscope was                            passed under direct vision. Throughout the                            procedure, the patient's blood pressure, pulse, and                            oxygen saturations were monitored continuously. The                            Endoscope was introduced through the mouth, and                            advanced to the second part of duodenum.  The upper                            GI endoscopy was accomplished without difficulty.                            The patient tolerated the procedure well. Scope In: Scope Out: Findings:                 Normal mucosa was found in the entire esophagus.                            Biopsies were taken with a cold forceps for                            histology to evaluate for reflux changes.                           A non-obstructing Schatzki ring was found at the                            gastroesophageal junction (37 cm from incisors).                           A 2 to 3 cm hiatal hernia was present.                           The gastroesophageal flap valve was visualized                            endoscopically and classified as Hill Grade III                            (minimal fold, loose to endoscope, hiatal hernia                            likely).                           Mild inflammation characterized  by congestion                            (edema) was found in the gastric antrum. Biopsies                            were taken with a cold forceps for histology and                            Helicobacter pylori testing.                           The examined duodenum was normal. Complications:            No immediate complications. Estimated Blood Loss:     Estimated blood loss was minimal. Impression:               - Normal mucosa was found in the entire esophagus.                            Biopsied.                           - Non-obstructing Schatzki ring.                           - 2 to 3 cm hiatal hernia.                           - Mild gastritis. Biopsied.                           - Normal examined duodenum. Recommendation:           - Patient has a contact number available for                            emergencies. The signs and symptoms of potential                            delayed complications were discussed with the                            patient.  Return to normal activities tomorrow.                            Written discharge instructions were provided to the                            patient.                           - Resume previous diet.                           - Continue present medications.                           -  Await pathology results.                           - Given atypical chest pain I would consider 24 hr                            pH and esophageal manometry to evaluate for                            uncontrolled reflux and esophageal spasm. Jerene Bears, MD 10/12/2020 4:01:34 PM This report has been signed electronically.

## 2020-10-12 NOTE — Progress Notes (Signed)
C.W. vital signs. 

## 2020-10-12 NOTE — Progress Notes (Signed)
Called to room to assist during endoscopic procedure.  Patient ID and intended procedure confirmed with present staff. Received instructions for my participation in the procedure from the performing physician.  

## 2020-10-12 NOTE — Op Note (Signed)
[facility] Patient Name: Michalla Ringer Procedure Date: 10/12/2020 3:27 PM MRN: [id number] Endoscopist: Jerene Bears , MD Age: 50 Referring MD:  Date of Birth: 04/26/1971 Gender: Female Account #: [id number] Procedure:                Colonoscopy Indications:              Screening for colorectal malignant neoplasm, This                            is the patient's first colonoscopy Medicines:                Monitored Anesthesia Care Procedure:                Pre-Anesthesia Assessment:                           - Prior to the procedure, a History and Physical                            was performed, and patient medications and                            allergies were reviewed. The patient's tolerance of                            previous anesthesia was also reviewed. The risks                            and benefits of the procedure and the sedation                            options and risks were discussed with the patient.                            All questions were answered, and informed consent                            was obtained. Prior Anticoagulants: The patient has                            taken no previous anticoagulant or antiplatelet                            agents. ASA Grade Assessment: II - A patient with                            mild systemic disease. After reviewing the risks                            and benefits, the patient was deemed in                            satisfactory condition to undergo the procedure.  After obtaining informed consent, the colonoscope                            was passed under direct vision. Throughout the                            procedure, the patient's blood pressure, pulse, and                            oxygen saturations were monitored continuously. The                            Olympus PCF-H190DL [id number]) Colonoscope was                            introduced through the  anus and advanced to the                            cecum, identified by appendiceal orifice and                            ileocecal valve. The colonoscopy was performed                            without difficulty. The patient tolerated the                            procedure well. The quality of the bowel                            preparation was good. The ileocecal valve,                            appendiceal orifice, and rectum were photographed. Scope In: 3:43:50 PM Scope Out: 3:56:58 PM Scope Withdrawal Time: 0 hours 11 minutes 7 seconds  Total Procedure Duration: 0 hours 13 minutes 8 seconds  Findings:                 The digital rectal exam was normal.                           The entire examined colon appeared normal on direct                            and retroflexion views. Complications:            No immediate complications. Estimated Blood Loss:     Estimated blood loss: none. Impression:               - The entire examined colon is normal on direct and                            retroflexion views.                           - No specimens collected. Recommendation:           -  Patient has a contact number available for                            emergencies. The signs and symptoms of potential                            delayed complications were discussed with the                            patient. Return to normal activities tomorrow.                            Written discharge instructions were provided to the                            patient.                           - Resume previous diet.                           - Continue present medications.                           - Repeat colonoscopy in 10 years for screening                            purposes. Jerene Bears, MD 10/12/2020 4:02:44 PM This report has been signed electronically.

## 2020-10-12 NOTE — Progress Notes (Signed)
Pt's states no medical or surgical changes since previsit or office visit. 

## 2020-10-12 NOTE — Progress Notes (Signed)
To PACU, VSS. Report to [email or web address] 

## 2020-10-14 ENCOUNTER — Telehealth: Payer: Self-pay | Admitting: *Deleted

## 2020-10-14 ENCOUNTER — Telehealth: Payer: Self-pay

## 2020-10-14 NOTE — Telephone Encounter (Signed)
LVM

## 2020-10-14 NOTE — Telephone Encounter (Signed)
  Follow up Call-  Call back number 10/12/2020  Post procedure Call Back phone  # [phone]  Permission to leave phone message Yes  Some recent data might be hidden     Patient questions:  Message left to call if necessary.

## 2020-10-25 ENCOUNTER — Encounter: Payer: Self-pay | Admitting: Internal Medicine

## 2020-12-16 ENCOUNTER — Other Ambulatory Visit: Payer: Self-pay

## 2020-12-16 ENCOUNTER — Encounter: Payer: Self-pay | Admitting: Neurology

## 2020-12-16 ENCOUNTER — Telehealth (INDEPENDENT_AMBULATORY_CARE_PROVIDER_SITE_OTHER): Payer: Managed Care, Other (non HMO) | Admitting: Neurology

## 2020-12-16 VITALS — Ht 66.0 in | Wt 205.0 lb

## 2020-12-16 DIAGNOSIS — G43709 Chronic migraine without aura, not intractable, without status migrainosus: Secondary | ICD-10-CM | POA: Diagnosis not present

## 2020-12-16 MED ORDER — NORTRIPTYLINE HCL 10 MG PO CAPS: 20.0000 mg | ORAL_CAPSULE | Freq: Every day | ORAL | 3 refills | Status: DC

## 2020-12-16 NOTE — Progress Notes (Signed)
   Virtual Visit via Video Note The purpose of this virtual visit is to provide medical care while limiting exposure to the novel coronavirus.    Consent was obtained for video visit:  Yes.   Answered questions that patient had about telehealth interaction:  Yes.   I discussed the limitations, risks, security and privacy concerns of performing an evaluation and management service by telemedicine. I also discussed with the patient that there may be a patient responsible charge related to this service. The patient expressed understanding and agreed to proceed.  Pt location: Home Physician Location: office Name of referring provider:  [place], Betty G, MD I connected with Jeanne Payne at patients initiation/request on 12/16/2020 at  9:30 AM EDT by video enabled telemedicine application and verified that I am speaking with the correct person using two identifiers. Pt MRN:  [id number] Pt DOB:  05-25-1971 Video Participants:  Jeanne Searing Semper   History of Present Illness: This is a 50 y.o. female returning for follow-up of migraine headaches. At her last visit, I tapered her off topiramate and started nortriptyline 20mg  daily.  She reports overall headache frequency has improved.  She typically has headaches around her menses, if she skips a meal, or sleep disruption.  She may have headaches about 2-4 times per month.  She has been able to treat the headaches with Imitex which resolves her headaches within 1-2 hours. She does not get recurrent headaches, like she previously did. Overall, she is happy being on nortriptyline.    Observations/Objective:   There were no vitals filed for this visit. Patient is awake, alert, and appears comfortable.  Oriented x 4.   Extraocular muscles are intact. No ptosis.  Face is symmetric.  Speech is not dysarthric. Antigravity in all extremities.  No pronator drift. Gait appears normal.   Assessment and Plan:  Chronic migraines - better  controlled on nortriptyline  - Previously tried topiramate/Trokendi up to 150mg /d  - Continue nortriptyline 20mg  at bedtime which is effectively controlling her headaches  - For severe migraines, continue Imitrex 100mg  prn   Follow Up Instructions:   I discussed the assessment and treatment plan with the patient. The patient was provided an opportunity to ask questions and all were answered. The patient agreed with the plan and demonstrated an understanding of the instructions.   The patient was advised to call back or seek an in-person evaluation if the symptoms worsen or if the condition fails to improve as anticipated.  Follow-up in 1 year   Alda Berthold, DO

## 2020-12-16 NOTE — Progress Notes (Signed)
Pt called at 8:41 was sent to voice mail. Left a message to call office back so we can update her chart for VV

## 2021-01-19 ENCOUNTER — Other Ambulatory Visit: Payer: Self-pay | Admitting: Family Medicine

## 2021-01-19 DIAGNOSIS — G43109 Migraine with aura, not intractable, without status migrainosus: Secondary | ICD-10-CM

## 2021-01-20 NOTE — Progress Notes (Signed)
HPI: Jeanne Payne is a 50 y.o. female, who is here today to follow  She was last seen on 08/02/20. Since her last visit she has followed with neurologist. Started on Nortriptyline 20 mg daily.  Anxiety: She resumed Lexapro 08/2020 because she noted she was more irritable. Negative for depressed mood.She has taken Lexapro 20 mg 1/2 tab but would like to increase dose to 20 mg. Medication helps. She has not noted side effects.  She tried Cymbalta, recommended to help with myalgias and upper back pain.She discontinued because it aggravated constipation.  She has trying to be consistent with a healthful diet. She does not exercising regularly but active taking care of her grandchildren.  Trouble falling asleep but once she does she can sleep 7-8 hours. In general she is sleeping better since she resumed Lexapro.  Having hot flashes in the past couple months. Planning on seeing her gyn.  Lab Results  Component Value Date   TSH 1.807 07/21/2019   Review of Systems  Constitutional:  Positive for fatigue. Negative for activity change, appetite change and unexpected weight change.  Respiratory:  Negative for shortness of breath.   Cardiovascular:  Negative for chest pain and palpitations.  Gastrointestinal:  Negative for abdominal pain, nausea and vomiting.       Negative for changes in bowel habits.  Skin:  Negative for rash.  Neurological:  Negative for syncope and weakness.  Hematological:  Negative for adenopathy. Does not bruise/bleed easily.  Psychiatric/Behavioral:  Positive for sleep disturbance. Negative for confusion, hallucinations and suicidal ideas. The patient is nervous/anxious.   Rest see pertinent positives and negatives per HPI.  Current Outpatient Medications on File Prior to Visit  Medication Sig Dispense Refill   esomeprazole (NEXIUM) 40 MG capsule Take 1 capsule (40 mg total) by mouth 2 (two) times daily before a meal. Take 30-60 mins prior to  meals 60 capsule 5   nortriptyline (PAMELOR) 10 MG capsule Take 2 capsules (20 mg total) by mouth at bedtime. Take 2 tablets at bedtime 180 capsule 3   SUMAtriptan (IMITREX) 100 MG tablet TAKE 1 TABLET(100 MG) BY MOUTH DAILY AS NEEDED FOR MIGRAINE 10 tablet 3   No current facility-administered medications on file prior to visit.   Past Medical History:  Diagnosis Date   Blood clot in vein    Blood Clot in right leg   Depression    Hyperlipidemia    Migraine    Allergies  Allergen Reactions   Benadryl [Diphenhydramine]     Restless legs    Social History   Socioeconomic History   Marital status: Married    Spouse name: Not on file   Number of children: Not on file   Years of education: Not on file   Highest education level: Not on file  Occupational History   Not on file  Tobacco Use   Smoking status: Former    Types: Cigarettes   Smokeless tobacco: Never  Vaping Use   Vaping Use: Never used  Substance and Sexual Activity   Alcohol use: No   Drug use: Never   Sexual activity: Yes    Birth control/protection: None  Other Topics Concern   Not on file  Social History Narrative   ** Merged History Encounter **    Right Handed   Lives in a one story home   Social Determinants of Health   Financial Resource Strain: Not on file  Food Insecurity: Not on file  Transportation Needs: Not  on file  Physical Activity: Not on file  Stress: Not on file  Social Connections: Not on file   Vitals:   01/23/21 1036  BP: 128/80  Pulse: 98  Resp: 16  Temp: 97.7 F (36.5 C)  SpO2: 99%   Wt Readings from Last 3 Encounters:  01/23/21 212 lb (96.2 kg)  12/16/20 205 lb (93 kg)  10/12/20 205 lb (93 kg)   Body mass index is 34.22 kg/m.  Physical Exam Vitals and nursing note reviewed.  Constitutional:      General: She is not in acute distress.    Appearance: She is well-developed.  HENT:     Head: Normocephalic and atraumatic.  Eyes:     Conjunctiva/sclera:  Conjunctivae normal.  Cardiovascular:     Rate and Rhythm: Normal rate and regular rhythm.     Heart sounds: No murmur heard. Pulmonary:     Effort: Pulmonary effort is normal. No respiratory distress.     Breath sounds: Normal breath sounds.  Abdominal:     Palpations: Abdomen is soft. There is no hepatomegaly or mass.     Tenderness: There is no abdominal tenderness.  Skin:    General: Skin is warm.     Findings: No erythema or rash.  Neurological:     General: No focal deficit present.     Mental Status: She is alert and oriented to person, place, and time.     Cranial Nerves: No cranial nerve deficit.     Gait: Gait normal.  Psychiatric:     Comments: Well groomed, good eye contact.   ASSESSMENT AND PLAN:  Ms.Valeen was seen today for medication refill.  Diagnoses and all orders for this visit:  Insomnia, unspecified type Continue good sleep hygiene. Nortriptyline may also be helping with staying asleep.  Anxiety disorder, unspecified type Improved after resuming Lexapro, increase dose from 10 mg to 20 mg daily. Some side effects discussed as well as the risk for interaction with TCA.  -     escitalopram (LEXAPRO) 20 MG tablet; Take 1 tablet (20 mg total) by mouth daily.  Class 1 obesity with serious comorbidity and body mass index (BMI) of 33.0 to 33.9 in adult, unspecified obesity type We discussed benefits of wt loss as well as adverse effects of obesity. Consistency with healthy diet and physical activity recommended. Weight Watchers is a good option as well as daily brisk walking for 15-30 min as tolerated.  Return in about 6 months (around 08/08/2021) for cpe.   Arizbeth Cawthorn G. Martin[place], MD  [facility]. [place] office.

## 2021-01-23 ENCOUNTER — Ambulatory Visit: Payer: Managed Care, Other (non HMO) | Admitting: Family Medicine

## 2021-01-23 ENCOUNTER — Other Ambulatory Visit: Payer: Self-pay

## 2021-01-23 ENCOUNTER — Encounter: Payer: Self-pay | Admitting: Family Medicine

## 2021-01-23 VITALS — BP 128/80 | HR 98 | Temp 97.7°F | Resp 16 | Ht 66.0 in | Wt 212.0 lb

## 2021-01-23 DIAGNOSIS — E669 Obesity, unspecified: Secondary | ICD-10-CM | POA: Diagnosis not present

## 2021-01-23 DIAGNOSIS — F419 Anxiety disorder, unspecified: Secondary | ICD-10-CM | POA: Diagnosis not present

## 2021-01-23 DIAGNOSIS — Z6833 Body mass index (BMI) 33.0-33.9, adult: Secondary | ICD-10-CM

## 2021-01-23 DIAGNOSIS — G4700 Insomnia, unspecified: Secondary | ICD-10-CM

## 2021-01-23 MED ORDER — ESCITALOPRAM OXALATE 20 MG PO TABS: 20.0000 mg | ORAL_TABLET | Freq: Every day | ORAL | 2 refills | Status: DC

## 2021-01-23 NOTE — Patient Instructions (Signed)
A few things to remember from today's visit:  Anxiety disorder, unspecified type - Plan: escitalopram (LEXAPRO) 20 MG tablet  If you need refills please call your pharmacy. Do not use My Chart to request refills or for acute issues that need immediate attention. Lexapro 20 mg to continue.  Please be sure medication list is accurate. If a new problem present, please set up appointment sooner than planned today.

## 2021-01-26 IMAGING — CT CT ANGIO CHEST
2 of 7 series · 19 of 46 positions shown · IV contrast (APPLIED)
Comparison: 07/21/2019 CT, chest x-ray from earlier in the same
day.

CLINICAL DATA: Chest pain and dyspnea of, history of deep venous
thrombosis

EXAM:
CT ANGIOGRAPHY CHEST WITH CONTRAST
TECHNIQUE: Multidetector CT imaging of the chest was performed using the
standard protocol during bolus administration of intravenous
contrast. Multiplanar CT image reconstructions and MIPs were
obtained to evaluate the vascular anatomy.
CONTRAST:  65mL OMNIPAQUE IOHEXOL 350 MG/ML SOLN

[Series 8: thins · axial · 0.54mm/px · z∈[+1103,+1326]mm · 16 of 359 slices shown]
[im 20/359  lung]
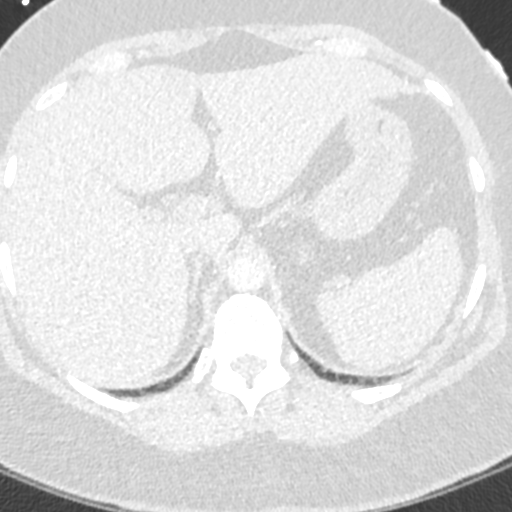
[im 40/359  soft-tissue]
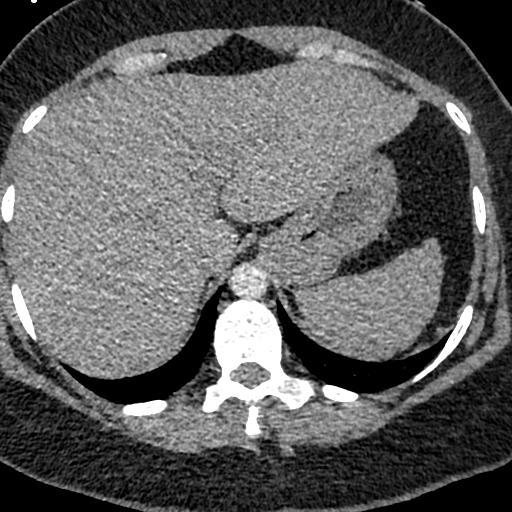
[im 60/359  lung]
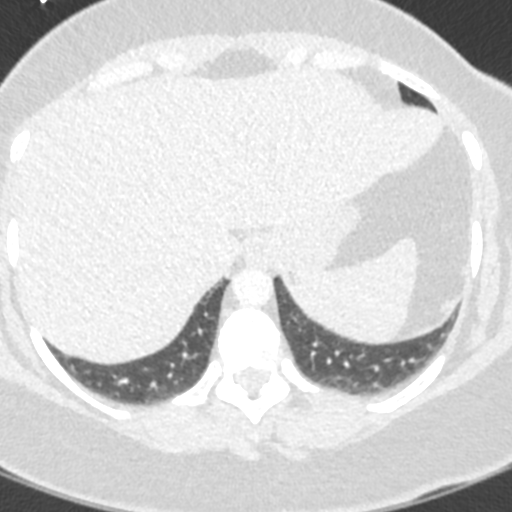
[im 80/359  soft-tissue]
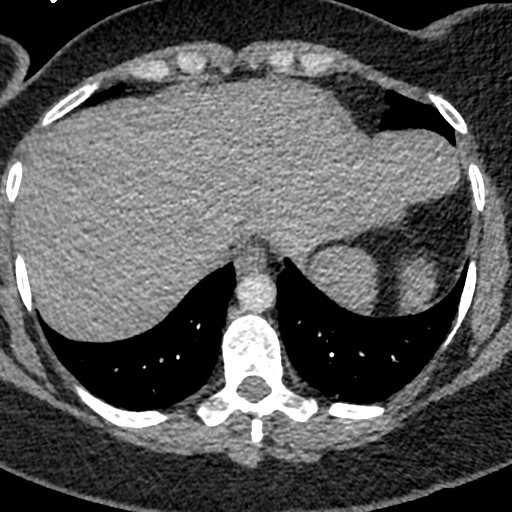
[im 100/359  lung]
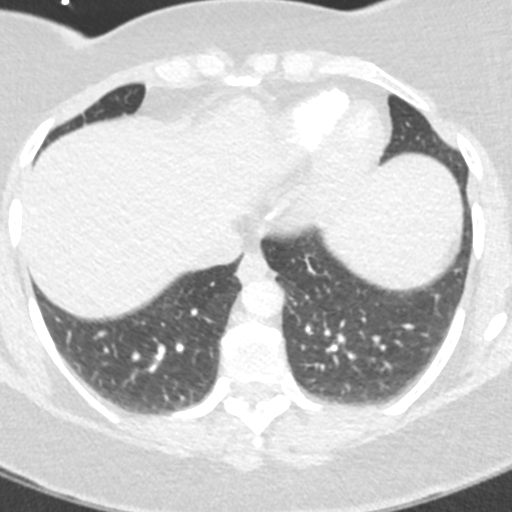
[im 120/359  soft-tissue]
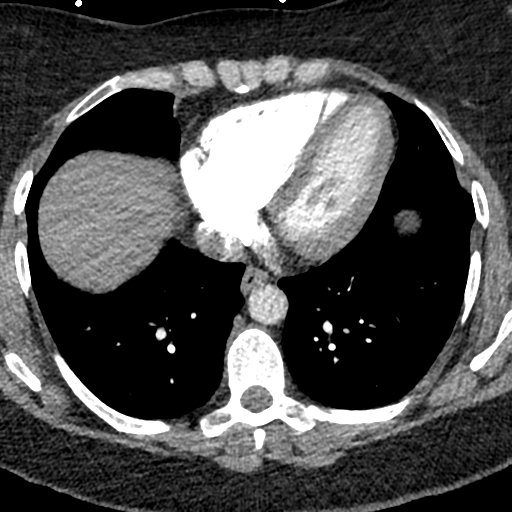
[im 140/359  lung]
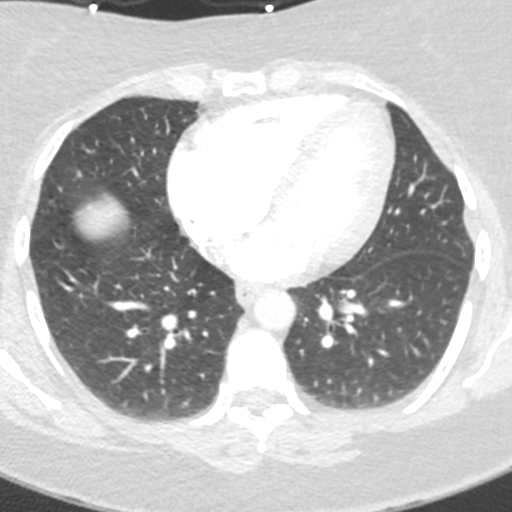
[im 160/359  soft-tissue]
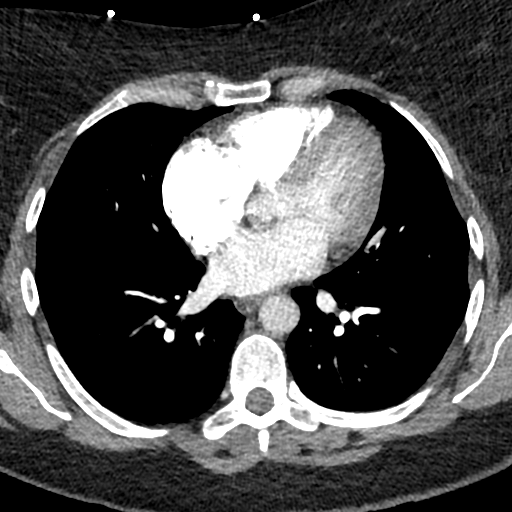
[im 199/359  lung]
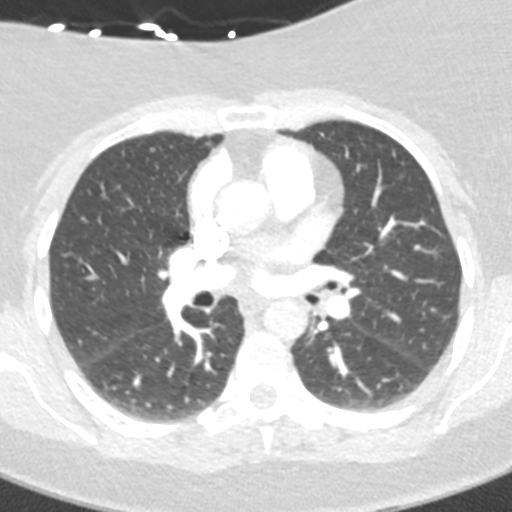
[im 219/359  soft-tissue]
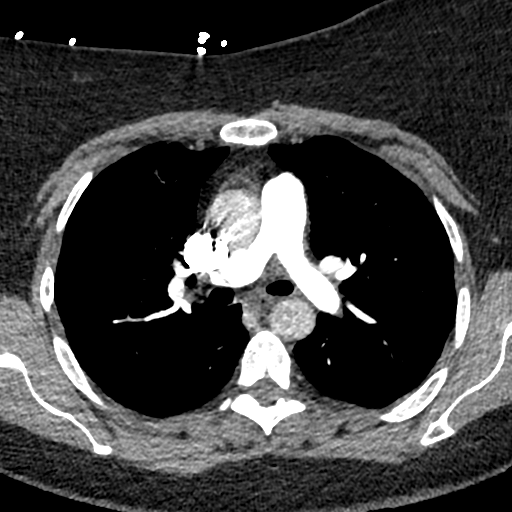
[im 239/359  lung]
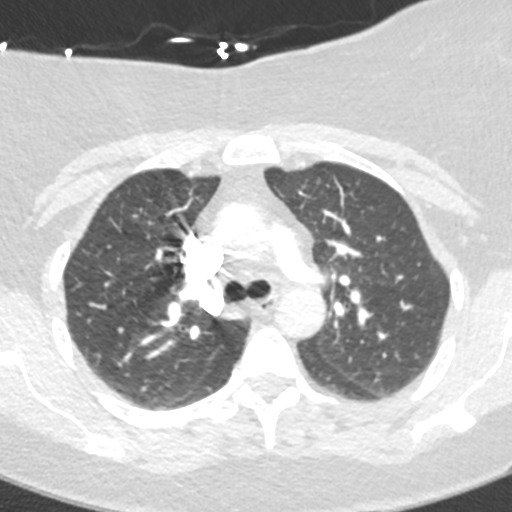
[im 259/359  soft-tissue]
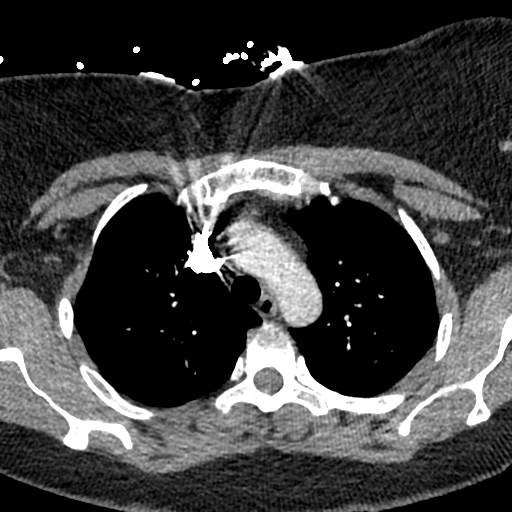
[im 279/359  lung]
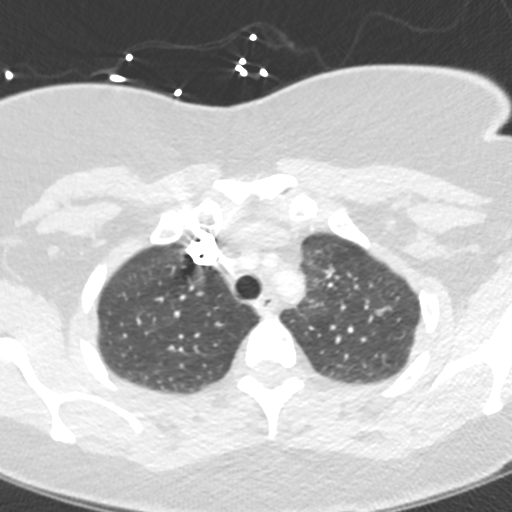
[im 299/359  soft-tissue]
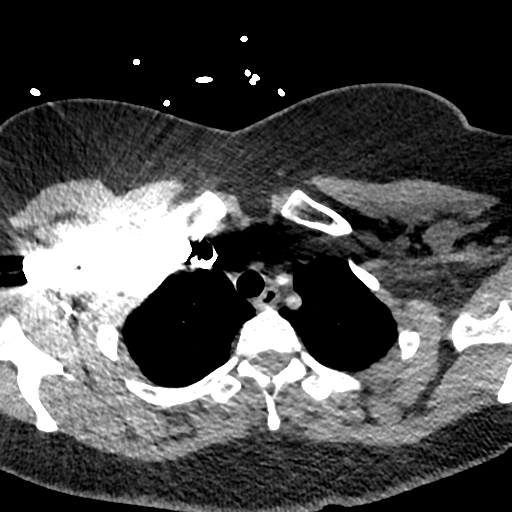
[im 319/359  lung]
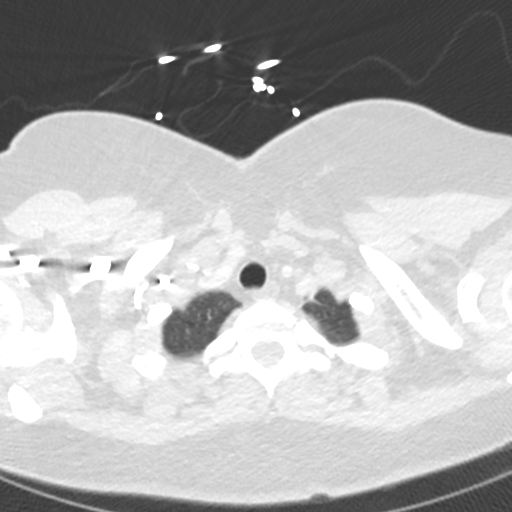
[im 339/359  soft-tissue]
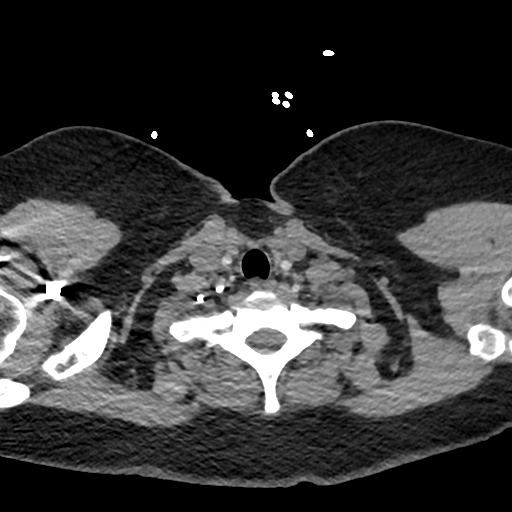

[Series 9: cor · coronal · 0.52mm/px · 3 of 119 slices shown]
[im 30/119  soft-tissue]
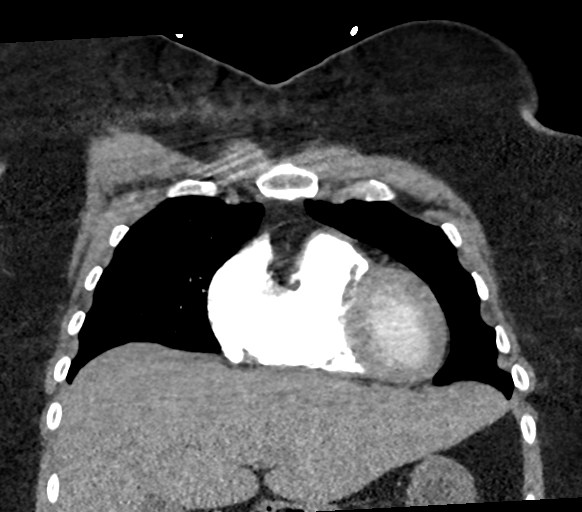
[im 60/119  soft-tissue]
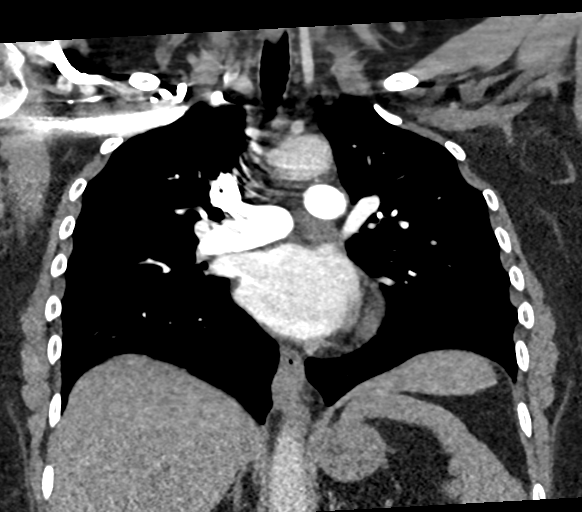
[im 89/119  soft-tissue]
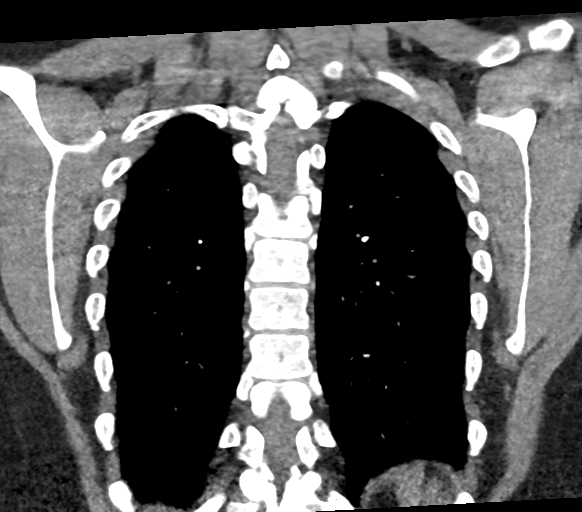

[19 of 46 positions shown; findings below may reference images not displayed]

FINDINGS: Cardiovascular: Thoracic aorta and its branches show no significant
atherosclerotic calcification. No cardiac enlargement is noted. No
aneurysmal dilatation or dissection of the aorta is seen. The
pulmonary artery is well visualized with a normal branching pattern.
No filling defects to suggest pulmonary emboli are identified. No
significant coronary calcifications are noted.

Mediastinum/Nodes: Thoracic inlet is within normal limits. No
sizable hilar or mediastinal adenopathy is noted. The esophagus as
visualized is within normal limits.

Lungs/Pleura: Lungs demonstrate some mild emphysematous change
without focal confluent infiltrate. No sizable effusion or
pneumothorax is noted. No parenchymal nodules are seen.

Upper Abdomen: Visualized upper abdomen is unremarkable.

Musculoskeletal: Mild degenerative changes of the thoracic spine are
noted. No acute bony abnormality is noted.

Review of the MIP images confirms the above findings.
IMPRESSION: No evidence of pulmonary emboli.

No acute abnormality noted.

## 2021-01-31 ENCOUNTER — Encounter: Payer: Self-pay | Admitting: Nurse Practitioner

## 2021-01-31 ENCOUNTER — Other Ambulatory Visit: Payer: Self-pay

## 2021-01-31 ENCOUNTER — Ambulatory Visit: Payer: Managed Care, Other (non HMO) | Admitting: Nurse Practitioner

## 2021-01-31 VITALS — BP 124/78

## 2021-01-31 DIAGNOSIS — N951 Menopausal and female climacteric states: Secondary | ICD-10-CM

## 2021-01-31 NOTE — Patient Instructions (Signed)
Devra Dopp, [place], OR Ginseng

## 2021-01-31 NOTE — Progress Notes (Signed)
   Acute Office Visit  Subjective:    Patient ID: Jeanne Payne, female    DOB: February 04, 1971, 50 y.o.   MRN: [id number]   HPI 50 y.o. presents today to discuss hot flashes. Symptoms initially started about 4 months ago as night sweats but now she is having hot flashes most days and difficulty falling asleep. 2008 endometrial ablation, no bleeding since visit last August. [facility] 01/2020 was 5. History of migraine with and without aura. Sees neurology for this and says most headaches do not include an aura but every once in a while she does have one. Medications were just changed and she has had improvement in headache frequency.    Review of Systems  Constitutional: Negative.   Endocrine: Positive for heat intolerance.  Genitourinary: Negative.   Psychiatric/Behavioral:  Positive for sleep disturbance.       Objective:    Physical Exam Constitutional:      Appearance: Normal appearance.  GU: Not indicated  BP 124/78   LMP  (LMP Unknown)  Wt Readings from Last 3 Encounters:  01/23/21 212 lb (96.2 kg)  12/16/20 205 lb (93 kg)  10/12/20 205 lb (93 kg)        Assessment & Plan:   Problem List Items Addressed This Visit   None Visit Diagnoses     Perimenopause    -  Primary   Relevant Orders   FSH   Vasomotor symptoms due to menopause          Plan: We discussed perimenopause and what to expect. We will recheck [facility] today since she does not have menses s/t ablation. Due to history of migraine with aura it is recommended to avoid estrogen-containing products due to increased risk for stroke. She is already on SSRI but does not take Lexapro consistently. Recommend taking consistently but will not add or change SSRI/SNRI for vasomotor symptoms. Provided her with OTC supplements and discussed lifestyle changes. I have agreed to reach out to her neurologist to discuss use of HRT based on migraine history and will reach back out then. She is agreeable to plan.       Tamela Gammon DNP, 12:23 PM 01/31/2021

## 2021-02-02 LAB — TSH: TSH: 3.09 mIU/L

## 2021-02-02 LAB — FOLLICLE STIMULATING HORMONE: FSH: 14.1 m[IU]/mL

## 2021-07-31 NOTE — Progress Notes (Signed)
HPI: Jeanne Payne is a 51 y.o. female, who is here today for her routine physical.  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 Following a healthful diet: She eats more chicken than red meat. Not a lot of vegetables or fruits. He eats potatoes with every meal.  Chronic medical problems: GERD,anxiety,migraine headaches,vit D def,chronic fatigue,and HLD among some.  Immunization History  Administered Date(s) Administered   Influenza-Unspecified 05/27/2015   Tdap 04/16/2013   Health Maintenance  Topic Date Due   INFLUENZA VACCINE  02/01/2022 (Originally 01/23/2021)   COVID-19 Vaccine (1) 02/01/2022 (Originally 11/09/1971)   HIV Screening  02/01/2022 (Originally 05/11/1986)   Zoster Vaccines- Shingrix (1 of 2) 02/01/2022 (Originally 05/11/2021)   PAP SMEAR-Modifier  10/23/2021   MAMMOGRAM  02/16/2022   TETANUS/TDAP  04/17/2023   COLONOSCOPY (Pts 45-26yrs Insurance coverage will need to be confirmed)  10/13/2030   Hepatitis C Screening  Completed   HPV VACCINES  Aged Out  She sees her gyn regularly, last visit 08/2020.  She has no concerns today. HLD: She is not on pharmacologic treatment.  Lab Results  Component Value Date   CHOL 236 (H) 08/02/2020   HDL 54.80 08/02/2020   LDLCALC 157 (H) 08/02/2020   TRIG 121.0 08/02/2020   CHOLHDL 4 08/02/2020   Vit D def: She is on Vit D3 2000 U 2 caps daily. She follows with neuro. Headaches not as intense, she is taking Nortriptyline 10 mg daily instead 20 mg.  Anxiety: She is on Lexapro 20 mg, which she still thinks is helping.   Review of Systems  Constitutional:  Positive for fatigue. Negative for appetite change and fever.  HENT:  Negative for hearing loss, mouth sores, sore throat, trouble swallowing and voice change.   Eyes:  Negative for redness and visual disturbance.  Respiratory:  Negative for cough, shortness of breath  and wheezing.   Cardiovascular:  Negative for chest pain and leg swelling.  Gastrointestinal:  Negative for abdominal pain, nausea and vomiting.       No changes in bowel habits.  Endocrine: Negative for cold intolerance, heat intolerance, polydipsia, polyphagia and polyuria.  Genitourinary:  Negative for decreased urine volume, dysuria, hematuria, vaginal bleeding and vaginal discharge.  Musculoskeletal:  Positive for arthralgias. Negative for gait problem.  Skin:  Negative for color change and rash.  Allergic/Immunologic: Negative for environmental allergies.  Neurological:  Negative for syncope, weakness and headaches.  Hematological:  Negative for adenopathy. Does not bruise/bleed easily.  Psychiatric/Behavioral:  Positive for sleep disturbance. Negative for confusion and hallucinations. The patient is nervous/anxious.   All other systems reviewed and are negative.  Current Outpatient Medications on File Prior to Visit  Medication Sig Dispense Refill   escitalopram (LEXAPRO) 20 MG tablet Take 1 tablet (20 mg total) by mouth daily. 90 tablet 2   esomeprazole (NEXIUM) 40 MG capsule Take 1 capsule (40 mg total) by mouth 2 (two) times daily before a meal. Take 30-60 mins prior to meals 60 capsule 5   nortriptyline (PAMELOR) 10 MG capsule Take 2 capsules (20 mg total) by mouth at bedtime. Take 2 tablets at bedtime 180 capsule 3   SUMAtriptan (IMITREX) 100 MG tablet TAKE 1 TABLET(100 MG) BY MOUTH DAILY AS NEEDED FOR MIGRAINE 10 tablet 3   No current facility-administered medications on file prior to visit.   Past Medical History:  Diagnosis Date   Blood clot  in vein    Blood Clot in right leg   Depression    Hyperlipidemia    Migraine    Past Surgical History:  Procedure Laterality Date   TUBAL LIGATION     Allergies  Allergen Reactions   Benadryl [Diphenhydramine]     Restless legs    Family History  Problem Relation Age of Onset   Hypertension Mother    Depression Mother     Alcohol abuse Father    Asthma Father    Cancer Father    COPD Father    Diabetes Father    Hearing loss Father    Heart disease Father    Colon polyps Father    Depression Sister    Early death Sister    Alcohol abuse Brother    Drug abuse Brother    Arthritis Paternal Grandmother    COPD Paternal Grandmother    Hearing loss Paternal Grandmother    Breast cancer Paternal Grandmother    Colon cancer Neg Hx    Esophageal cancer Neg Hx    Pancreatic cancer Neg Hx    Stomach cancer Neg Hx     Social History   Socioeconomic History   Marital status: Married    Spouse name: Not on file   Number of children: Not on file   Years of education: Not on file   Highest education level: Not on file  Occupational History   Not on file  Tobacco Use   Smoking status: Former    Types: Cigarettes   Smokeless tobacco: Never  Vaping Use   Vaping Use: Never used  Substance and Sexual Activity   Alcohol use: No   Drug use: Never   Sexual activity: Yes    Birth control/protection: None  Other Topics Concern   Not on file  Social History Narrative   ** Merged History Encounter **    Right Handed   Lives in a one story home   Social Determinants of Health   Financial Resource Strain: Not on file  Food Insecurity: Not on file  Transportation Needs: Not on file  Physical Activity: Not on file  Stress: Not on file  Social Connections: Not on file   Vitals:   08/04/21 0931  BP: 116/70  Pulse: 86  Resp: 16  Temp: 99 F (37.2 C)  SpO2: 99%   Body mass index is 34.22 kg/m.  Wt Readings from Last 3 Encounters:  08/04/21 212 lb (96.2 kg)  01/23/21 212 lb (96.2 kg)  12/16/20 205 lb (93 kg)   Physical Exam Vitals and nursing note reviewed.  Constitutional:      General: She is not in acute distress.    Appearance: She is well-developed.  HENT:     Head: Normocephalic and atraumatic.     Right Ear: Hearing, tympanic membrane, ear canal and external ear normal.      Left Ear: Hearing, tympanic membrane, ear canal and external ear normal.     Mouth/Throat:     Mouth: Mucous membranes are moist.     Pharynx: Oropharynx is clear. Uvula midline.  Eyes:     Extraocular Movements: Extraocular movements intact.     Conjunctiva/sclera: Conjunctivae normal.     Pupils: Pupils are equal, round, and reactive to light.  Neck:     Thyroid: No thyromegaly.     Trachea: No tracheal deviation.  Cardiovascular:     Rate and Rhythm: Normal rate and regular rhythm.     Pulses:  Dorsalis pedis pulses are 2+ on the right side and 2+ on the left side.     Heart sounds: No murmur heard. Pulmonary:     Effort: Pulmonary effort is normal. No respiratory distress.     Breath sounds: Normal breath sounds.  Abdominal:     Palpations: Abdomen is soft. There is no hepatomegaly or mass.     Tenderness: There is no abdominal tenderness.  Genitourinary:    Comments: Deferred to gyn. Musculoskeletal:     Comments: No major deformity or signs of synovitis appreciated.  Lymphadenopathy:     Cervical: No cervical adenopathy.     Upper Body:     Right upper body: No supraclavicular adenopathy.     Left upper body: No supraclavicular adenopathy.  Skin:    General: Skin is warm.     Findings: No erythema or rash.  Neurological:     General: No focal deficit present.     Mental Status: She is alert and oriented to person, place, and time.     Cranial Nerves: No cranial nerve deficit.     Coordination: Coordination normal.     Gait: Gait normal.     Deep Tendon Reflexes:     Reflex Scores:      Bicep reflexes are 2+ on the right side and 2+ on the left side.      Patellar reflexes are 2+ on the right side and 2+ on the left side. Psychiatric:        Speech: Speech normal.     Comments: Well groomed, good eye contact.   ASSESSMENT AND PLAN:  Ms. Jeanne Payne was here today annual physical examination.  Orders Placed This Encounter  Procedures    VITAMIN D 25 Hydroxy (Vit-D Deficiency, Fractures)   Lipid panel   Hemoglobin A1c   Comprehensive metabolic panel    Lab Results  Component Value Date   CHOL 203 (H) 08/04/2021   HDL 48.30 08/04/2021   LDLCALC 134 (H) 08/04/2021   TRIG 104.0 08/04/2021   CHOLHDL 4 08/04/2021   Lab Results  Component Value Date   HGBA1C 5.8 08/04/2021   Lab Results  Component Value Date   CREATININE 1.01 08/04/2021   BUN 13 08/04/2021   NA 136 08/04/2021   K 4.3 08/04/2021   CL 99 08/04/2021   CO2 32 08/04/2021   Lab Results  Component Value Date   ALT 35 08/04/2021   AST 29 08/04/2021   ALKPHOS 65 08/04/2021   BILITOT 0.4 08/04/2021   Routine general medical examination at a health care facility We discussed the importance of regular physical activity and healthy diet for prevention of chronic illness and/or complications. Preventive guidelines reviewed. Vaccination: She prefers to hold on Shingrix vaccine. Ca++ and vit D supplementation recommended. Continue female preventive care with ehr gyn. Next CPE in a year.  The 10-year ASCVD risk score (Arnett DK, et al., 2019) is: 1.2%   Values used to calculate the score:     Age: 33 years     Sex: Female     Is Non-Hispanic African American: No     Diabetic: No     Tobacco smoker: No     Systolic Blood Pressure: 099 mmHg     Is BP treated: No     HDL Cholesterol: 48.3 mg/dL     Total Cholesterol: 203 mg/dL  Screening for endocrine, metabolic and immunity disorder -     Comprehensive metabolic panel -  Hemoglobin A1c  Hyperlipidemia, unspecified hyperlipidemia type Non pharmacologic treatment recommended for now. Further recommendations will be given according to 10 years CVD risk score and lipid panel numbers.  Vitamin D deficiency, unspecified Continue current dose of vit D supplementation, further recommendations according to 25 [place] vit D result.  Anxiety disorder, unspecified type Problem is stable. Lexapro 20 mg  seems to be helping. As far as symptoms are stable, I think it is appropriate to follow annually, before if needed.  Return in 1 year (on 08/04/2022) for CPE and f/u.  Sherrel Ploch G. Martin[place], MD  [facility]. [place] office.

## 2021-08-04 ENCOUNTER — Ambulatory Visit (INDEPENDENT_AMBULATORY_CARE_PROVIDER_SITE_OTHER): Payer: Managed Care, Other (non HMO) | Admitting: Family Medicine

## 2021-08-04 ENCOUNTER — Encounter: Payer: Self-pay | Admitting: Family Medicine

## 2021-08-04 VITALS — BP 116/70 | HR 86 | Temp 99.0°F | Resp 16 | Ht 66.0 in | Wt 212.0 lb

## 2021-08-04 DIAGNOSIS — Z0000 Encounter for general adult medical examination without abnormal findings: Secondary | ICD-10-CM | POA: Diagnosis not present

## 2021-08-04 DIAGNOSIS — E559 Vitamin D deficiency, unspecified: Secondary | ICD-10-CM

## 2021-08-04 DIAGNOSIS — Z1329 Encounter for screening for other suspected endocrine disorder: Secondary | ICD-10-CM

## 2021-08-04 DIAGNOSIS — E785 Hyperlipidemia, unspecified: Secondary | ICD-10-CM | POA: Diagnosis not present

## 2021-08-04 DIAGNOSIS — F419 Anxiety disorder, unspecified: Secondary | ICD-10-CM

## 2021-08-04 DIAGNOSIS — Z130 Encounter for screening for diseases of the blood and blood-forming organs and certain disorders involving the immune mechanism: Secondary | ICD-10-CM

## 2021-08-04 DIAGNOSIS — Z13228 Encounter for screening for other metabolic disorders: Secondary | ICD-10-CM

## 2021-08-04 LAB — HEMOGLOBIN A1C: Hgb A1c MFr Bld: 5.8 % (ref 4.6–6.5)

## 2021-08-04 LAB — VITAMIN D 25 HYDROXY (VIT D DEFICIENCY, FRACTURES): VITD: 54.39 ng/mL (ref 30.00–100.00)

## 2021-08-04 LAB — LIPID PANEL
Cholesterol: 203 mg/dL — ABNORMAL HIGH (ref 0–200)
HDL: 48.3 mg/dL (ref >39.00)
LDL Cholesterol: 134 mg/dL — ABNORMAL HIGH (ref 0–99)
NonHDL: 154.99
Total CHOL/HDL Ratio: 4
Triglycerides: 104 mg/dL (ref 0.0–149.0)
VLDL: 20.8 mg/dL (ref 0.0–40.0)

## 2021-08-04 LAB — COMPREHENSIVE METABOLIC PANEL
ALT: 35 U/L (ref 0–35)
AST: 29 U/L (ref 0–37)
Albumin: 4 g/dL (ref 3.5–5.2)
Alkaline Phosphatase: 65 U/L (ref 39–117)
BUN: 13 mg/dL (ref 6–23)
CO2: 32 mEq/L (ref 19–32)
Calcium: 10 mg/dL (ref 8.4–10.5)
Chloride: 99 mEq/L (ref 96–112)
Creatinine, Ser: 1.01 mg/dL (ref 0.40–1.20)
GFR: 65.04 mL/min (ref >60.00)
Glucose, Bld: 93 mg/dL (ref 70–99)
Potassium: 4.3 mEq/L (ref 3.5–5.1)
Sodium: 136 mEq/L (ref 135–145)
Total Bilirubin: 0.4 mg/dL (ref 0.2–1.2)
Total Protein: 8.1 g/dL (ref 6.0–8.3)

## 2021-08-04 NOTE — Patient Instructions (Addendum)
A few things to remember from today's visit:   Routine general medical examination at a health care facility  Screening for endocrine, metabolic and immunity disorder - Plan: Hemoglobin A1c, Comprehensive metabolic panel  Hyperlipidemia, unspecified hyperlipidemia type - Plan: Lipid panel  Vitamin D deficiency, unspecified - Plan: VITAMIN D 25 Hydroxy (Vit-D Deficiency, Fractures)  If you need refills please call your pharmacy. Do not use My Chart to request refills or for acute issues that need immediate attention.   Please be sure medication list is accurate. If a new problem present, please set up appointment sooner than planned today. As far as anxiety is stable I think it is appropriate to follow annually.  Health Maintenance, Female Adopting a healthy lifestyle and getting preventive care are important in promoting health and wellness. Ask your health care provider about: The right schedule for you to have regular tests and exams. Things you can do on your own to prevent diseases and keep yourself healthy. What should I know about diet, weight, and exercise? Eat a healthy diet  Eat a diet that includes plenty of vegetables, fruits, low-fat dairy products, and lean protein. Do not eat a lot of foods that are high in solid fats, added sugars, or sodium. Maintain a healthy weight Body mass index (BMI) is used to identify weight problems. It estimates body fat based on height and weight. Your health care provider can help determine your BMI and help you achieve or maintain a healthy weight. Get regular exercise Get regular exercise. This is one of the most important things you can do for your health. Most adults should: Exercise for at least 150 minutes each week. The exercise should increase your heart rate and make you sweat (moderate-intensity exercise). Do strengthening exercises at least twice a week. This is in addition to the moderate-intensity exercise. Spend less time  sitting. Even light physical activity can be beneficial. Watch cholesterol and blood lipids Have your blood tested for lipids and cholesterol at 51 years of age, then have this test every 5 years. Have your cholesterol levels checked more often if: Your lipid or cholesterol levels are high. You are older than 51 years of age. You are at high risk for heart disease. What should I know about cancer screening? Depending on your health history and family history, you may need to have cancer screening at various ages. This may include screening for: Breast cancer. Cervical cancer. Colorectal cancer. Skin cancer. Lung cancer. What should I know about heart disease, diabetes, and high blood pressure? Blood pressure and heart disease High blood pressure causes heart disease and increases the risk of stroke. This is more likely to develop in people who have high blood pressure readings or are overweight. Have your blood pressure checked: Every 3-5 years if you are 66-33 years of age. Every year if you are 4 years old or older. Diabetes Have regular diabetes screenings. This checks your fasting blood sugar level. Have the screening done: Once every three years after age 21 if you are at a normal weight and have a low risk for diabetes. More often and at a younger age if you are overweight or have a high risk for diabetes. What should I know about preventing infection? Hepatitis B If you have a higher risk for hepatitis B, you should be screened for this virus. Talk with your health care provider to find out if you are at risk for hepatitis B infection. Hepatitis C Testing is recommended for: Everyone born  from [place] through 1965. Anyone with known risk factors for hepatitis C. Sexually transmitted infections (STIs) Get screened for STIs, including gonorrhea and chlamydia, if: You are sexually active and are younger than 51 years of age. You are older than 51 years of age and your health care  provider tells you that you are at risk for this type of infection. Your sexual activity has changed since you were last screened, and you are at increased risk for chlamydia or gonorrhea. Ask your health care provider if you are at risk. Ask your health care provider about whether you are at high risk for HIV. Your health care provider may recommend a prescription medicine to help prevent HIV infection. If you choose to take medicine to prevent HIV, you should first get tested for HIV. You should then be tested every 3 months for as long as you are taking the medicine. Pregnancy If you are about to stop having your period (premenopausal) and you may become pregnant, seek counseling before you get pregnant. Take 400 to 800 micrograms (mcg) of folic acid every day if you become pregnant. Ask for birth control (contraception) if you want to prevent pregnancy. Osteoporosis and menopause Osteoporosis is a disease in which the bones lose minerals and strength with aging. This can result in bone fractures. If you are 75 years old or older, or if you are at risk for osteoporosis and fractures, ask your health care provider if you should: Be screened for bone loss. Take a calcium or vitamin D supplement to lower your risk of fractures. Be given hormone replacement therapy (HRT) to treat symptoms of menopause. Follow these instructions at home: Alcohol use Do not drink alcohol if: Your health care provider tells you not to drink. You are pregnant, may be pregnant, or are planning to become pregnant. If you drink alcohol: Limit how much you have to: 0-1 drink a day. Know how much alcohol is in your drink. In the [place] one drink equals one 12 oz bottle of beer (355 mL), one 5 oz glass of wine (148 mL), or one 1 oz glass of hard liquor (44 mL). Lifestyle Do not use any products that contain nicotine or tobacco. These products include cigarettes, chewing tobacco, and vaping devices, such as e-cigarettes.  If you need help quitting, ask your health care provider. Do not use street drugs. Do not share needles. Ask your health care provider for help if you need support or information about quitting drugs. General instructions Schedule regular health, dental, and eye exams. Stay current with your vaccines. Tell your health care provider if: You often feel depressed. You have ever been abused or do not feel safe at home. Summary Adopting a healthy lifestyle and getting preventive care are important in promoting health and wellness. Follow your health care provider's instructions about healthy diet, exercising, and getting tested or screened for diseases. Follow your health care provider's instructions on monitoring your cholesterol and blood pressure. This information is not intended to replace advice given to you by your health care provider. Make sure you discuss any questions you have with your health care provider. Document Revised: 10/31/2020 Document Reviewed: 10/31/2020 Elsevier Patient Education  [employer] [place].

## 2021-08-10 ENCOUNTER — Telehealth: Payer: Self-pay | Admitting: *Deleted

## 2021-08-10 NOTE — Telephone Encounter (Signed)
PA submitted via Covermymeds for Esomeprazole

## 2021-08-14 NOTE — Telephone Encounter (Signed)
PA approved for generic Nexium.  

## 2021-09-15 ENCOUNTER — Encounter: Payer: Self-pay | Admitting: Neurology

## 2021-11-11 ENCOUNTER — Other Ambulatory Visit: Payer: Self-pay | Admitting: Family Medicine

## 2021-11-11 DIAGNOSIS — G43109 Migraine with aura, not intractable, without status migrainosus: Secondary | ICD-10-CM

## 2021-12-18 ENCOUNTER — Ambulatory Visit: Payer: Managed Care, Other (non HMO) | Admitting: Neurology

## 2022-02-10 ENCOUNTER — Other Ambulatory Visit: Payer: Self-pay | Admitting: Family Medicine

## 2022-02-10 DIAGNOSIS — F419 Anxiety disorder, unspecified: Secondary | ICD-10-CM

## 2022-03-02 NOTE — Progress Notes (Unsigned)
    Follow-up Visit   Date: 03/05/22   Jeanne Payne MRN: [id number] DOB: 01/02/1971   Interim History: Jeanne Payne is a 51 y.o. right-handed Caucasian female with hyperlipidemia, migraine, and GERD  returning to the clinic for migraines.  The patient was accompanied to the clinic by self.  IMPRESSION/PLAN: Episodic migraine  - Previously tried:  topiramate/Trokendi up to '150mg'$ /d, nortriptyline '20mg'$   - OK to stay off preventative agent at this time given headaches are not frequent  - If headaches get worse, restart nortriptyline  - For severe migraine, use imitrex '100mg'$  prn  Return to clinic in 1 year  ----------------------------------------------------------------------- UPDATE 03/05/2022:  She is here for 1 year visit.  She ran out of nortriptyline a few weeks ago, but has not noticed a marked change in headache frequency. Headaches occur 1-2 times per month, lasting 2-3 days. She continues to take imitrex for severe migraine, which gives 12 hour relief. She reports nortriptyline sometimes makes her sleepy, but otherwise tolerate it well. She does not wish to be on any preventative at this time.    Medications:  Current Outpatient Medications on File Prior to Visit  Medication Sig Dispense Refill   escitalopram (LEXAPRO) 20 MG tablet TAKE 1 TABLET(20 MG) BY MOUTH DAILY 90 tablet 0   SUMAtriptan (IMITREX) 100 MG tablet TAKE 1 TABLET(100 MG) BY MOUTH DAILY AS NEEDED FOR MIGRAINE 10 tablet 3   nortriptyline (PAMELOR) 10 MG capsule Take 2 capsules (20 mg total) by mouth at bedtime. Take 2 tablets at bedtime (Patient not taking: Reported on 03/05/2022) 180 capsule 3   No current facility-administered medications on file prior to visit.    Allergies:  Allergies  Allergen Reactions   Benadryl [Diphenhydramine]     Restless legs    Vital Signs:  BP (!) 145/93   Pulse 84   Ht '5\' 6"'$  (1.676 m)   Wt 212 lb (96.2 kg)   SpO2 96%   BMI 34.22 kg/m    Neurological Exam: MENTAL STATUS including orientation to time, place, person, recent and remote memory, attention span and concentration, language, and fund of knowledge is normal.  Speech is not dysarthric.  CRANIAL NERVES:  No visual field defects.  Pupils equal round and reactive to light.  Normal conjugate, extra-ocular eye movements in all directions of gaze.  No ptosis.  Face is symmetric.   MOTOR:  Motor strength is 5/5 in all extremities.  No atrophy, fasciculations or abnormal movements.  No pronator drift.  Tone is normal.    COORDINATION/GAIT:  Normal finger-to- nose-finger.  Intact rapid alternating movements bilaterally.  Gait narrow based and stable.   Data: n/a   Thank you for allowing me to participate in patient's care.  If I can answer any additional questions, I would be pleased to do so.    Sincerely,    Jacqulynn Shappell K. Posey Pronto, DO

## 2022-03-05 ENCOUNTER — Encounter: Payer: Self-pay | Admitting: Neurology

## 2022-03-05 ENCOUNTER — Ambulatory Visit: Payer: Managed Care, Other (non HMO) | Admitting: Neurology

## 2022-03-05 DIAGNOSIS — G43109 Migraine with aura, not intractable, without status migrainosus: Secondary | ICD-10-CM

## 2022-03-05 MED ORDER — SUMATRIPTAN SUCCINATE 100 MG PO TABS: ORAL_TABLET | ORAL | 11 refills | Status: DC

## 2022-03-05 NOTE — Patient Instructions (Signed)
Return to clinic in 1 year.

## 2022-03-12 ENCOUNTER — Other Ambulatory Visit: Payer: Self-pay | Admitting: Family Medicine

## 2022-03-12 DIAGNOSIS — G43109 Migraine with aura, not intractable, without status migrainosus: Secondary | ICD-10-CM

## 2022-05-11 ENCOUNTER — Other Ambulatory Visit: Payer: Self-pay | Admitting: Family Medicine

## 2022-05-11 DIAGNOSIS — F419 Anxiety disorder, unspecified: Secondary | ICD-10-CM

## 2022-05-25 ENCOUNTER — Ambulatory Visit: Payer: Managed Care, Other (non HMO) | Admitting: Family Medicine

## 2022-05-25 ENCOUNTER — Encounter: Payer: Self-pay | Admitting: Family Medicine

## 2022-05-25 ENCOUNTER — Telehealth: Payer: Self-pay

## 2022-05-25 VITALS — BP 128/80 | HR 84 | Temp 98.0°F | Resp 12 | Ht 66.0 in | Wt 212.2 lb

## 2022-05-25 DIAGNOSIS — R519 Headache, unspecified: Secondary | ICD-10-CM | POA: Insufficient documentation

## 2022-05-25 DIAGNOSIS — R42 Dizziness and giddiness: Secondary | ICD-10-CM

## 2022-05-25 DIAGNOSIS — R0602 Shortness of breath: Secondary | ICD-10-CM

## 2022-05-25 DIAGNOSIS — H814 Vertigo of central origin: Secondary | ICD-10-CM

## 2022-05-25 MED ORDER — MECLIZINE HCL 25 MG PO TABS: 25.0000 mg | ORAL_TABLET | Freq: Every evening | ORAL | 0 refills | Status: AC | PRN

## 2022-05-25 NOTE — Telephone Encounter (Signed)
---  pt reports being dizzy for "well over a week". dizziness at rest. had a headache before the dizziness started, worst she's had in years. hx of migraines. last couple of days has also had a hard time catching breath. currently lightheaded.  05/24/2022 11:32:36 AM See PCP within 24 Hours Altamease Oiler, RN, Adri[place]  Referrals REFERRED TO PCP OFFICE  Pt has appt with PCP on 05/25/22

## 2022-05-25 NOTE — Progress Notes (Unsigned)
ACUTE VISIT Chief Complaint  Patient presents with   Dizziness    X a week, had the same thing a few months ago but it went away on its own.    HPI: [name] is a 51 y.o. female with medical hx of HLD,migraine headache,and anxiety here today complaining of dizziness as described above. Having dizziness for about a week, which she describes as more like lightheadedness. She reports having similar symptoms a couple of months ago that resolved on their own.  No associated symptoms.  Dizziness This is a recurrent problem. The current episode started more than 1 month ago. The problem occurs intermittently. The problem has been unchanged. Associated symptoms include fatigue and a visual change. Pertinent negatives include no abdominal pain, change in bowel habit, congestion, diaphoresis, fever, nausea, neck pain, numbness, rash, sore throat, swollen glands, urinary symptoms, vomiting or weakness. Nothing aggravates the symptoms. She has tried nothing for the symptoms.   The dizziness occurs in episodes, sometimes while in bed, and has been more constant on some days. She has been under some stress, she is currently staying with her parents and taking care of them, which has left her feeling tired and experiencing variable sleep quality.  Occasional right-sided chest pain,"indigestion", and heartburn. She was taking generic Nexium for these symptoms but stopped due to insurance issues.  Sometimes she feels her heart racing, her apple watch reports mild tachycardia but no arrhythmia x 2.   She also mentions having shortness of breath with certain activities, intermittent for the past two years, which has been more noticeable recently. her mother has commented on her increased shortness of breath. No associated cough or wheezing.  She has not experienced hearing changes, occasional tinnitus for years.   She also reports an episode of blurred vision and a severe headache,  which she considered a migraine, about two and a half weeks ago. During this episode, her vision seemed to be closing in, and the headache worsened after taking her migraine pill. She went to bed and woke up next day with no headache.  She is concerned about serious neurologic process, like CVD.  Review of Systems  Constitutional:  Positive for fatigue. Negative for diaphoresis and fever.  HENT:  Negative for congestion and sore throat.   Gastrointestinal:  Negative for abdominal pain, change in bowel habit, nausea and vomiting.  Genitourinary:  Negative for decreased urine volume, dysuria and hematuria.  Musculoskeletal:  Negative for neck pain.  Skin:  Negative for rash.  Neurological:  Positive for dizziness. Negative for weakness and numbness.  Psychiatric/Behavioral:  Positive for sleep disturbance. Negative for confusion. The patient is nervous/anxious.   See other pertinent positives and negatives in HPI.  Current Outpatient Medications on File Prior to Visit  Medication Sig Dispense Refill   escitalopram (LEXAPRO) 20 MG tablet TAKE 1 TABLET(20 MG) BY MOUTH DAILY 90 tablet 1   SUMAtriptan (IMITREX) 100 MG tablet TAKE 1 TABLET(100 MG) BY MOUTH DAILY AS NEEDED FOR MIGRAINE 10 tablet 11   No current facility-administered medications on file prior to visit.   Past Medical History:  Diagnosis Date   Blood clot in vein    Blood Clot in right leg   Depression    Hyperlipidemia    Migraine    Allergies  Allergen Reactions   Benadryl [Diphenhydramine]     Restless legs    Social History   Socioeconomic History   Marital status: Married    Spouse name: Not on  file   Number of children: 3   Years of education: Not on file   Highest education level: Not on file  Occupational History   Not on file  Tobacco Use   Smoking status: Former    Types: Cigarettes   Smokeless tobacco: Never  Vaping Use   Vaping Use: Never used  Substance and Sexual Activity   Alcohol use: No    Drug use: Never   Sexual activity: Yes    Birth control/protection: None  Other Topics Concern   Not on file  Social History Narrative   ** Merged History Encounter **    Right Handed   Lives in a one story home   Social Determinants of Health   Financial Resource Strain: Not on file  Food Insecurity: Not on file  Transportation Needs: Not on file  Physical Activity: Not on file  Stress: Not on file  Social Connections: Not on file   Vitals:   05/25/22 1411  BP: 128/80  Pulse: 84  Resp: 12  Temp: 98 F (36.7 C)  SpO2: 98%   Body mass index is 34.26 kg/m.  Physical Exam Vitals and nursing note reviewed.  Constitutional:      General: She is not in acute distress.    Appearance: She is well-developed.  HENT:     Head: Normocephalic and atraumatic.     Mouth/Throat:     Mouth: Mucous membranes are moist.     Pharynx: Oropharynx is clear.  Eyes:     Conjunctiva/sclera: Conjunctivae normal.  Neck:     Vascular: No carotid bruit.     Comments: Apley maneuver and Dix-Hallpike maneuver negative. Mild symptoms, right side, associated nystagmus. Cardiovascular:     Rate and Rhythm: Normal rate and regular rhythm.     Heart sounds: No murmur heard. Pulmonary:     Effort: Pulmonary effort is normal. No respiratory distress.     Breath sounds: Normal breath sounds.  Abdominal:     Palpations: Abdomen is soft. There is no hepatomegaly or mass.     Tenderness: There is no abdominal tenderness.  Musculoskeletal:     Right lower leg: No edema.     Left lower leg: No edema.  Lymphadenopathy:     Cervical: No cervical adenopathy.  Skin:    General: Skin is warm.     Findings: No erythema or rash.  Neurological:     General: No focal deficit present.     Mental Status: She is alert and oriented to person, place, and time.     Cranial Nerves: No cranial nerve deficit.     Gait: Gait normal.     Deep Tendon Reflexes:     Reflex Scores:      Patellar reflexes are 2+ on  the right side and 2+ on the left side. Psychiatric:        Mood and Affect: Affect normal. Mood is anxious.    ASSESSMENT AND PLAN:  Ms. Jeanne Payne was here today  for dizziness.  Lab Results  Component Value Date   CREATININE 0.87 05/25/2022   BUN 10 05/25/2022   NA 139 05/25/2022   K 4.3 05/25/2022   CL 102 05/25/2022   CO2 28 05/25/2022   Lab Results  Component Value Date   WBC 10.6 05/25/2022   HGB 14.4 05/25/2022   HCT 43.0 05/25/2022   MCV 85.8 05/25/2022   PLT 328 05/25/2022   SOB (shortness of breath) on exertion Assessment & Plan:  She has had problem for 2 years and reporting that problem is getting worse. We discussed possible causes. Hx does not suggest a serious process. ? Deconditioning. EKG done today: SR,normal axis, and intervals.No significant changes when compared with EKG in 03/2020. Cardiology evaluation could be arranged, we decided to hold on this. Monitor for new symptoms. Instructed about warning signs.  Orders: -     EKG 12-Lead  Dizziness Assessment & Plan: We had a long discussion about possible etiologies. She is very concerned about possibility of neuro disorder. Some parts of Hx and examination today suggest benign vertigo. Blood work and head imaging ordered today. Explained that vertigo can be recurrent. Fall prevention. Vestibular exercises recommended, handout with Semont maneuvers given. Meclizine 25 mg daily at bedtime for 5 days and then prn, some side effects discussed. Instructed about warning signs.  Orders: -     EKG 12-Lead -     Meclizine HCl; Take 1 tablet (25 mg total) by mouth at bedtime as needed for dizziness.  Dispense: 30 tablet; Refill: 0 -     CBC; Future -     Basic metabolic panel; Future  Vertigo of central origin -     CT HEAD WO CONTRAST (5MM); Future  Headache, unspecified headache type Assessment & Plan: She has hx of migraine headache, this past headache was more intense that her  usual episodes. ? Tension headache. Examination today does not suggest a serious neurologic process, she is concerned about the possibility. Because persistent dizziness, head CT will be arranged. Instructed about warning signs.   Orders: -     CT HEAD WO CONTRAST (5MM); Future  I spent a total of 41 minutes in both face to face and non face to face activities for this visit on the date of this encounter, excluding the time when EKG was performed. During this time history was obtained and documented, examination was performed, prior labs reviewed, and assessment/plan discussed.  Return if symptoms worsen or fail to improve, for keep next appointment.  Dreydon Cardenas G. Martin[place], MD  [facility]. [place] office.

## 2022-05-25 NOTE — Assessment & Plan Note (Signed)
She has had problem for 2 years. We discussed possible causes. Hx doe snot suggest a serious process. ? Deconditioning. EKG done today.

## 2022-05-25 NOTE — Patient Instructions (Addendum)
A few things to remember from today's visit:  Dizziness - Plan: Basic metabolic panel, CBC, EKG 07-EMLJ, meclizine (ANTIVERT) 25 MG tablet, CANCELED: Basic metabolic panel, CANCELED: CBC  SOB (shortness of breath) on exertion - Plan: EKG 12-Lead  Vertigo of central origin - Plan: CT HEAD WO CONTRAST (5MM)  Headache, unspecified headache type - Plan: CT HEAD WO CONTRAST (5MM) Could be vertigo. Try Meclizine 25 mg daily at bedtime for 5 days. Head CT will be arranged.   If you need refills for medications you take chronically, please call your pharmacy. Do not use My Chart to request refills or for acute issues that need immediate attention. If you send a my chart message, it may take a few days to be addressed, specially if I am not in the office.  Please be sure medication list is accurate. If a new problem present, please set up appointment sooner than planned today.

## 2022-05-26 LAB — CBC
HCT: 43 % (ref 35.0–45.0)
Hemoglobin: 14.4 g/dL (ref 11.7–15.5)
MCH: 28.7 pg (ref 27.0–33.0)
MCHC: 33.5 g/dL (ref 32.0–36.0)
MCV: 85.8 fL (ref 80.0–100.0)
MPV: 10.1 fL (ref 7.5–12.5)
Platelets: 328 Thousand/uL (ref 140–400)
RBC: 5.01 Million/uL (ref 3.80–5.10)
RDW: 13 % (ref 11.0–15.0)
WBC: 10.6 Thousand/uL (ref 3.8–10.8)

## 2022-05-26 LAB — BASIC METABOLIC PANEL
BUN: 10 mg/dL (ref 7–25)
CO2: 28 mmol/L (ref 20–32)
Calcium: 9.7 mg/dL (ref 8.6–10.4)
Chloride: 102 mmol/L (ref 98–110)
Creat: 0.87 mg/dL (ref 0.50–1.03)
Glucose, Bld: 81 mg/dL (ref 65–99)
Potassium: 4.3 mmol/L (ref 3.5–5.3)
Sodium: 139 mmol/L (ref 135–146)

## 2022-05-26 NOTE — Assessment & Plan Note (Signed)
We had a long discussion about possible etiologies. She is very concerned about possibility of neuro disorder. Some parts of Hx and examination today suggest benign vertigo. Blood work and head imaging ordered today. Explained that vertigo can be recurrent. Fall prevention. Vestibular exercises recommended, handout with Semont maneuvers given. Meclizine 25 mg daily at bedtime for 5 days and then prn, some side effects discussed. Instructed about warning signs.

## 2022-05-26 NOTE — Assessment & Plan Note (Signed)
She has hx of migraine headache, this past headache was more intense that her usual episodes. ? Tension headache. Examination today does not suggest a serious neurologic process, she is concerned about the possibility. Because persistent dizziness, head CT will be arranged. Instructed about warning signs.

## 2022-06-08 ENCOUNTER — Telehealth: Payer: Self-pay | Admitting: Family Medicine

## 2022-06-08 NOTE — Telephone Encounter (Signed)
Calling to check on PA for  CT of the head [phone]

## 2022-06-13 ENCOUNTER — Ambulatory Visit (HOSPITAL_BASED_OUTPATIENT_CLINIC_OR_DEPARTMENT_OTHER): Payer: Managed Care, Other (non HMO)

## 2022-06-14 ENCOUNTER — Encounter: Payer: Self-pay | Admitting: Family Medicine

## 2022-06-28 ENCOUNTER — Telehealth: Payer: Self-pay | Admitting: Family Medicine

## 2022-06-28 NOTE — Telephone Encounter (Signed)
[facility] is calling and CT HEAD WO CONTRAST need authorization. Pt is sch for 06-30-2022 at [place]

## 2022-06-29 ENCOUNTER — Telehealth: Payer: Managed Care, Other (non HMO) | Admitting: Physician Assistant

## 2022-06-29 DIAGNOSIS — U071 COVID-19: Secondary | ICD-10-CM

## 2022-06-29 MED ORDER — BENZONATATE 100 MG PO CAPS: 100.0000 mg | ORAL_CAPSULE | Freq: Three times a day (TID) | ORAL | 0 refills | Status: DC | PRN

## 2022-06-29 MED ORDER — NIRMATRELVIR/RITONAVIR (PAXLOVID)TABLET: 3.0000 | ORAL_TABLET | Freq: Two times a day (BID) | ORAL | 0 refills | Status: AC

## 2022-06-29 NOTE — Patient Instructions (Signed)
Rozell Searing Siedlecki, thank you for joining Mar Daring, PA-C for today's virtual visit.  While this provider is not your primary care provider (PCP), if your PCP is located in our provider database this encounter information will be shared with them immediately following your visit.   [place] account gives you access to today's visit and all your visits, tests, and labs performed at [facility] " click here if you don't have a [facility] account or go to [email or web address]  Consent: (Patient) Brodi Nery Cheema provided verbal consent for this virtual visit at the beginning of the encounter.  Current Medications:  Current Outpatient Medications:    benzonatate (TESSALON) 100 MG capsule, Take 1 capsule (100 mg total) by mouth 3 (three) times daily as needed., Disp: 30 capsule, Rfl: 0   nirmatrelvir/ritonavir (PAXLOVID) 20 x 150 MG & 10 x '100MG'$  TABS, Take 3 tablets by mouth 2 (two) times daily for 5 days. (Take nirmatrelvir 150 mg two tablets twice daily for 5 days and ritonavir 100 mg one tablet twice daily for 5 days) Patient GFR is 81, Disp: 30 tablet, Rfl: 0   escitalopram (LEXAPRO) 20 MG tablet, TAKE 1 TABLET(20 MG) BY MOUTH DAILY, Disp: 90 tablet, Rfl: 1   meclizine (ANTIVERT) 25 MG tablet, Take 1 tablet (25 mg total) by mouth at bedtime as needed for dizziness., Disp: 30 tablet, Rfl: 0   SUMAtriptan (IMITREX) 100 MG tablet, TAKE 1 TABLET(100 MG) BY MOUTH DAILY AS NEEDED FOR MIGRAINE, Disp: 10 tablet, Rfl: 11   Medications ordered in this encounter:  Meds ordered this encounter  Medications   nirmatrelvir/ritonavir (PAXLOVID) 20 x 150 MG & 10 x '100MG'$  TABS    Sig: Take 3 tablets by mouth 2 (two) times daily for 5 days. (Take nirmatrelvir 150 mg two tablets twice daily for 5 days and ritonavir 100 mg one tablet twice daily for 5 days) Patient GFR is 81    Dispense:  30 tablet    Refill:  0    Order Specific Question:    Supervising Provider    Answer:   Chase Picket [[id number]]   benzonatate (TESSALON) 100 MG capsule    Sig: Take 1 capsule (100 mg total) by mouth 3 (three) times daily as needed.    Dispense:  30 capsule    Refill:  0    Order Specific Question:   Supervising Provider    Answer:   Chase Picket [id number]     *If you need refills on other medications prior to your next appointment, please contact your pharmacy*  Follow-Up: Call back or seek an in-person evaluation if the symptoms worsen or if the condition fails to improve as anticipated.  [place] [phone]  Other Instructions  Can take to lessen severity: Vit C '500mg'$  twice daily Quercertin 250-'500mg'$  twice daily Zinc 75-'100mg'$  daily Melatonin 3-6 mg at bedtime Vit D3 1000-2000 IU daily Aspirin 81 mg daily with food Optional: Famotidine '20mg'$  daily Also can add tylenol/ibuprofen as needed for fevers and body aches May add Mucinex or Mucinex DM as needed for cough/congestion   COVID-19 COVID-19, or coronavirus disease 2019, is an infection that is caused by a new (novel) coronavirus called SARS-CoV-2. COVID-19 can cause many symptoms. In some people, the virus may not cause any symptoms. In others, it may cause mild or severe symptoms. Some people with severe infection develop severe disease. What are the causes? This illness is caused by a virus.  The virus may be in the air as tiny specks of fluid (aerosols) or droplets, or it may be on surfaces. You may catch the virus by: Breathing in droplets from an infected person. Droplets can be spread by a person breathing, speaking, singing, coughing, or sneezing. Touching something, like a table or a doorknob, that has virus on it (is contaminated) and then touching your mouth, nose, or eyes. What increases the risk? Risk for infection: You are more likely to get infected with the COVID-19 virus if: You are within 6 ft (1.8 m) of a person with COVID-19 for 15  minutes or longer. You are providing care for a person who is infected with COVID-19. You are in close personal contact with other people. Close personal contact includes hugging, kissing, or sharing eating or drinking utensils. Risk for serious illness caused by COVID-19: You are more likely to get seriously ill from the COVID-19 virus if: You have cancer. You have a long-term (chronic) disease, such as: Chronic lung disease. This includes pulmonary embolism, chronic obstructive pulmonary disease, and cystic fibrosis. Long-term disease that lowers your body's ability to fight infection (immunocompromise). Serious cardiac conditions, such as heart failure, coronary artery disease, or cardiomyopathy. Diabetes. Chronic kidney disease. Liver diseases. These include cirrhosis, nonalcoholic fatty liver disease, alcoholic liver disease, or autoimmune hepatitis. You have obesity. You are pregnant or were recently pregnant. You have sickle cell disease. What are the signs or symptoms? Symptoms of this condition can range from mild to severe. Symptoms may appear any time from 2 to 14 days after being exposed to the virus. They include: Fever or chills. Shortness of breath or trouble breathing. Feeling tired or very tired. Headaches, body aches, or muscle aches. Runny or stuffy nose, sneezing, coughing, or sore throat. New loss of taste or smell. This is rare. Some people may also have stomach problems, such as nausea, vomiting, or diarrhea. Other people may not have any symptoms of COVID-19. How is this diagnosed? This condition may be diagnosed by testing samples to check for the COVID-19 virus. The most common tests are the PCR test and the antigen test. Tests may be done in the lab or at home. They include: Using a swab to take a sample of fluid from the back of your nose and throat (nasopharyngeal fluid), from your nose, or from your throat. Testing a sample of saliva from your  mouth. Testing a sample of coughed-up mucus from your lungs (sputum). How is this treated? Treatment for COVID-19 infection depends on the severity of the condition. Mild symptoms can be managed at home with rest, fluids, and over-the-counter medicines. Serious symptoms may be treated in a hospital intensive care unit (ICU). Treatment in the ICU may include: Supplemental oxygen. Extra oxygen is given through a tube in the nose, a face mask, or a hood. Medicines. These may include: Antivirals, such as monoclonal antibodies. These help your body fight off certain viruses that can cause disease. Anti-inflammatories, such as corticosteroids. These reduce inflammation and suppress the immune system. Antithrombotics. These prevent or treat blood clots, if they develop. Convalescent plasma. This helps boost your immune system, if you have an underlying immunosuppressive condition or are getting immunosuppressive treatments. Prone positioning. This means you will lie on your stomach. This helps oxygen to get into your lungs. Infection control measures. If you are at risk for more serious illness caused by COVID-19, your health care provider may prescribe two long-acting monoclonal antibodies, given together every 6 months. How is  this prevented? To protect yourself: Use preventive medicine (pre-exposure prophylaxis). You may get pre-exposure prophylaxis if you have moderate or severe immunocompromise. Get vaccinated. Anyone 86 months old or older who meets guidelines can get a COVID-19 vaccine or vaccine series. This includes people who are pregnant or making breast milk (lactating). Get an added dose of COVID-19 vaccine after your first vaccine or vaccine series if you have moderate to severe immunocompromise. This applies if you have had a solid organ transplant or have been diagnosed with an immunocompromising condition. You should get the added dose 4 weeks after you got the first COVID-19 vaccine or  vaccine series. If you get an mRNA vaccine, you will need a 3-dose primary series. If you get the J&J/Janssen vaccine, you will need a 2-dose primary series, with the second dose being an mRNA vaccine. Talk to your health care provider about getting experimental monoclonal antibodies. This treatment is approved under emergency use authorization to prevent severe illness before or after being exposed to the COVID-19 virus. You may be given monoclonal antibodies if: You have moderate or severe immunocompromise. This includes treatments that lower your immune response. People with immunocompromise may not develop protection against COVID-19 when they are vaccinated. You cannot be vaccinated. You may not get a vaccine if you have a severe allergic reaction to the vaccine or its components. You are not fully vaccinated. You are in a facility where COVID-19 is present and: Are in close contact with a person who is infected with the COVID-19 virus. Are at high risk of being exposed to the COVID-19 virus. You are at risk of illness from new variants of the COVID-19 virus. To protect others: If you have symptoms of COVID-19, take steps to prevent the virus from spreading to others. Stay home. Leave your house only to get medical care. Do not use public transit, if possible. Do not travel while you are sick. Wash your hands often with soap and water for at least 20 seconds. If soap and water are not available, use alcohol-based hand sanitizer. Make sure that all people in your household wash their hands well and often. Cough or sneeze into a tissue or your sleeve or elbow. Do not cough or sneeze into your hand or into the air. Where to find more information Centers for Disease Control and Prevention: CharmCourses.be World Health Organization: https://www.castaneda.info/ Get help right away if: You have trouble breathing. You have pain or pressure in your chest. You are confused. You  have bluish lips and fingernails. You have trouble waking from sleep. You have symptoms that get worse. These symptoms may be an emergency. Get help right away. Call 911. Do not wait to see if the symptoms will go away. Do not drive yourself to the hospital. Summary COVID-19 is an infection that is caused by a new coronavirus. Sometimes, there are no symptoms. Other times, symptoms range from mild to severe. Some people with a severe COVID-19 infection develop severe disease. The virus that causes COVID-19 can spread from person to person through droplets or aerosols from breathing, speaking, singing, coughing, or sneezing. Mild symptoms of COVID-19 can be managed at home with rest, fluids, and over-the-counter medicines. This information is not intended to replace advice given to you by your health care provider. Make sure you discuss any questions you have with your health care provider. Document Revised: 05/30/2021 Document Reviewed: 06/01/2021 Elsevier Patient Education  [employer].    If you have been instructed to have an  in-person evaluation today at a local Urgent Care facility, please use the link below. It will take you to a list of all of our available [facility], including address, phone number and hours of operation. Please do not delay care.  [facility]  If you or a family member do not have a primary care provider, use the link below to schedule a visit and establish care. When you choose a [facility] primary care physician or advanced practice provider, you gain a long-term partner in health. Find a Primary Care Provider  Learn more about [facility]'s in-office and virtual care options: [facility]

## 2022-06-29 NOTE — Progress Notes (Signed)
Virtual Visit Consent   Jeanne Payne, you are scheduled for a virtual visit with a [facility] provider today. Just as with appointments in the office, your consent must be obtained to participate. Your consent will be active for this visit and any virtual visit you may have with one of our providers in the next 365 days. If you have a MyChart account, a copy of this consent can be sent to you electronically.  As this is a virtual visit, video technology does not allow for your provider to perform a traditional examination. This may limit your provider's ability to fully assess your condition. If your provider identifies any concerns that need to be evaluated in person or the need to arrange testing (such as labs, EKG, etc.), we will make arrangements to do so. Although advances in technology are sophisticated, we cannot ensure that it will always work on either your end or our end. If the connection with a video visit is poor, the visit may have to be switched to a telephone visit. With either a video or telephone visit, we are not always able to ensure that we have a secure connection.  By engaging in this virtual visit, you consent to the provision of healthcare and authorize for your insurance to be billed (if applicable) for the services provided during this visit. Depending on your insurance coverage, you may receive a charge related to this service.  I need to obtain your verbal consent now. Are you willing to proceed with your visit today? Jeanne Payne has provided verbal consent on 06/29/2022 (52) for a virtual visit (video or telephone). Mar Daring, PA-C  Date: 06/29/2022 12:13 PM  Virtual Visit via Video Note   I, Mar Daring, connected with  Jeanne Payne  ([id number], 1971/05/31) on 06/29/22 at 12:00 PM EST by a video-enabled telemedicine application and verified that I am speaking with the correct person using two  identifiers.  Location: Patient: Virtual Visit Location Patient: Home Provider: Virtual Visit Location Provider: Home Office   I discussed the limitations of evaluation and management by telemedicine and the availability of in person appointments. The patient expressed understanding and agreed to proceed.    History of Present Illness: Jeanne Payne is a 52 y.o. who identifies as a female who was assigned female at birth, and is being seen today for Covid 13.  HPI: URI  This is a new problem. The current episode started in the past 7 days (Symptoms started 3 days ago; tested positive on at home test last night). The problem has been gradually worsening. Maximum temperature: subjective fevers. Associated symptoms include congestion, coughing, headaches, nausea, rhinorrhea, sinus pain, sneezing, a sore throat (mild) and vomiting (wednesday night). Pertinent negatives include no diarrhea (looser stools), ear pain, plugged ear sensation or wheezing. Associated symptoms comments: myalgias. She has tried NSAIDs and acetaminophen (heating pad) for the symptoms. The treatment provided no relief.     Problems:  Patient Active Problem List   Diagnosis Date Noted   SOB (shortness of breath) on exertion 05/25/2022   Dizziness 05/25/2022   Headache, unspecified headache type 05/25/2022   Vitamin D deficiency, unspecified 08/02/2020   Chronic fatigue 01/25/2020   Class 1 obesity with body mass index (BMI) of 33.0 to 33.9 in adult 01/25/2020   Migraine headache with aura 10/20/2019   GERD (gastroesophageal reflux disease) 10/20/2019   Anxiety disorder 10/20/2019   Hyperlipidemia 07/22/2019    Allergies:  Allergies  Allergen Reactions  Benadryl [Diphenhydramine]     Restless legs   Medications:  Current Outpatient Medications:    benzonatate (TESSALON) 100 MG capsule, Take 1 capsule (100 mg total) by mouth 3 (three) times daily as needed., Disp: 30 capsule, Rfl: 0    nirmatrelvir/ritonavir (PAXLOVID) 20 x 150 MG & 10 x '100MG'$  TABS, Take 3 tablets by mouth 2 (two) times daily for 5 days. (Take nirmatrelvir 150 mg two tablets twice daily for 5 days and ritonavir 100 mg one tablet twice daily for 5 days) Patient GFR is 81, Disp: 30 tablet, Rfl: 0   escitalopram (LEXAPRO) 20 MG tablet, TAKE 1 TABLET(20 MG) BY MOUTH DAILY, Disp: 90 tablet, Rfl: 1   meclizine (ANTIVERT) 25 MG tablet, Take 1 tablet (25 mg total) by mouth at bedtime as needed for dizziness., Disp: 30 tablet, Rfl: 0   SUMAtriptan (IMITREX) 100 MG tablet, TAKE 1 TABLET(100 MG) BY MOUTH DAILY AS NEEDED FOR MIGRAINE, Disp: 10 tablet, Rfl: 11  Observations/Objective: Patient is well-developed, well-nourished in no acute distress.  Resting comfortably at home.  Head is normocephalic, atraumatic.  No labored breathing.  Speech is clear and coherent with logical content.  Patient is alert and oriented at baseline.    Assessment and Plan: 1. COVID-19 - nirmatrelvir/ritonavir (PAXLOVID) 20 x 150 MG & 10 x '100MG'$  TABS; Take 3 tablets by mouth 2 (two) times daily for 5 days. (Take nirmatrelvir 150 mg two tablets twice daily for 5 days and ritonavir 100 mg one tablet twice daily for 5 days) Patient GFR is 81  Dispense: 30 tablet; Refill: 0 - benzonatate (TESSALON) 100 MG capsule; Take 1 capsule (100 mg total) by mouth 3 (three) times daily as needed.  Dispense: 30 capsule; Refill: 0 - MyChart COVID-19 home monitoring program; Future  - Continue OTC symptomatic management of choice - Will send OTC vitamins and supplement information through AVS - Paxlovid and Tessalon prescribed - Patient enrolled in MyChart symptom monitoring - Push fluids - Rest as needed - Discussed return precautions and when to seek in-person evaluation, sent via AVS as well  Follow Up Instructions: I discussed the assessment and treatment plan with the patient. The patient was provided an opportunity to ask questions and all were  answered. The patient agreed with the plan and demonstrated an understanding of the instructions.  A copy of instructions were sent to the patient via MyChart unless otherwise noted below.    The patient was advised to call back or seek an in-person evaluation if the symptoms worsen or if the condition fails to improve as anticipated.  Time:  I spent 12 minutes with the patient via telehealth technology discussing the above problems/concerns.    Mar Daring, PA-C

## 2022-06-30 ENCOUNTER — Ambulatory Visit (HOSPITAL_BASED_OUTPATIENT_CLINIC_OR_DEPARTMENT_OTHER): Payer: Managed Care, Other (non HMO)

## 2022-07-12 ENCOUNTER — Encounter (HOSPITAL_BASED_OUTPATIENT_CLINIC_OR_DEPARTMENT_OTHER): Payer: Self-pay

## 2022-07-12 ENCOUNTER — Ambulatory Visit (HOSPITAL_BASED_OUTPATIENT_CLINIC_OR_DEPARTMENT_OTHER)
Admission: RE | Admit: 2022-07-12 | Discharge: 2022-07-12 | Disposition: A | Discharge status: Home / Self Care | Payer: Managed Care, Other (non HMO) | Source: Ambulatory Visit | Attending: Family Medicine | Admitting: Family Medicine

## 2022-07-12 DIAGNOSIS — H814 Vertigo of central origin: Secondary | ICD-10-CM | POA: Insufficient documentation

## 2022-07-12 DIAGNOSIS — R519 Headache, unspecified: Secondary | ICD-10-CM

## 2022-09-10 NOTE — Progress Notes (Unsigned)
HPI: [name] is a 52 y.o. female, who is here today for her routine physical.  Last CPE: 08/04/21  Regular exercise 3 or more time per week: *** Following a healthy diet: ***  Chronic medical problems: ***  Immunization History  Administered Date(s) Administered  . Influenza-Unspecified 05/27/2015  . Tdap 04/16/2013   Health Maintenance  Topic Date Due  . COVID-19 Vaccine (1) Never done  . HIV Screening  Never done  . Zoster Vaccines- Shingrix (1 of 2) Never done  . PAP SMEAR-Modifier  10/23/2021  . MAMMOGRAM  02/16/2022  . INFLUENZA VACCINE  09/23/2022 (Originally 01/23/2022)  . DTaP/Tdap/Td (2 - Td or Tdap) 04/17/2023  . COLONOSCOPY (Pts 45-23yrs Insurance coverage will need to be confirmed)  10/13/2030  . Hepatitis C Screening  Completed  . HPV VACCINES  Aged Out    She has *** concerns today.  Review of Systems  Current Outpatient Medications on File Prior to Visit  Medication Sig Dispense Refill  . benzonatate (TESSALON) 100 MG capsule Take 1 capsule (100 mg total) by mouth 3 (three) times daily as needed. 30 capsule 0  . escitalopram (LEXAPRO) 20 MG tablet TAKE 1 TABLET(20 MG) BY MOUTH DAILY 90 tablet 1  . meclizine (ANTIVERT) 25 MG tablet Take 1 tablet (25 mg total) by mouth at bedtime as needed for dizziness. 30 tablet 0  . SUMAtriptan (IMITREX) 100 MG tablet TAKE 1 TABLET(100 MG) BY MOUTH DAILY AS NEEDED FOR MIGRAINE 10 tablet 11   No current facility-administered medications on file prior to visit.    Past Medical History:  Diagnosis Date  . Blood clot in vein    Blood Clot in right leg  . Depression   . Hyperlipidemia   . Migraine     Past Surgical History:  Procedure Laterality Date  . TUBAL LIGATION      Allergies  Allergen Reactions  . Benadryl [Diphenhydramine]     Restless legs    Family History  Problem Relation Age of Onset  . Hypertension Mother   . Depression Mother   . Alcohol abuse Father   . Asthma  Father   . Cancer Father   . COPD Father   . Diabetes Father   . Hearing loss Father   . Heart disease Father   . Colon polyps Father   . Depression Sister   . Early death Sister   . Alcohol abuse Brother   . Drug abuse Brother   . Arthritis Paternal Grandmother   . COPD Paternal Grandmother   . Hearing loss Paternal Grandmother   . Breast cancer Paternal Grandmother   . Colon cancer Neg Hx   . Esophageal cancer Neg Hx   . Pancreatic cancer Neg Hx   . Stomach cancer Neg Hx     Social History   Socioeconomic History  . Marital status: Married    Spouse name: Not on file  . Number of children: 3  . Years of education: Not on file  . Highest education level: Not on file  Occupational History  . Not on file  Tobacco Use  . Smoking status: Former    Types: Cigarettes  . Smokeless tobacco: Never  Vaping Use  . Vaping Use: Never used  Substance and Sexual Activity  . Alcohol use: No  . Drug use: Never  . Sexual activity: Yes    Birth control/protection: None  Other Topics Concern  . Not on file  Social History Narrative   **  Merged History Encounter **    Right Handed   Lives in a one story home   Social Determinants of Health   Financial Resource Strain: Not on file  Food Insecurity: Not on file  Transportation Needs: Not on file  Physical Activity: Not on file  Stress: Not on file  Social Connections: Not on file    There were no vitals filed for this visit. There is no height or weight on file to calculate BMI.  Wt Readings from Last 3 Encounters:  05/25/22 212 lb 4 oz (96.3 kg)  03/05/22 212 lb (96.2 kg)  08/04/21 212 lb (96.2 kg)    Physical Exam Vitals and nursing note reviewed.  Constitutional:      General: She is not in acute distress.    Appearance: She is well-developed.  HENT:     Head: Normocephalic and atraumatic.     Right Ear: Hearing, tympanic membrane, ear canal and external ear normal.     Left Ear: Hearing, tympanic membrane,  ear canal and external ear normal.     Mouth/Throat:     Mouth: Mucous membranes are moist.     Pharynx: Oropharynx is clear. Uvula midline.  Eyes:     Extraocular Movements: Extraocular movements intact.     Conjunctiva/sclera: Conjunctivae normal.     Pupils: Pupils are equal, round, and reactive to light.  Neck:     Thyroid: No thyromegaly.     Trachea: No tracheal deviation.  Cardiovascular:     Rate and Rhythm: Normal rate and regular rhythm.     Pulses:          Dorsalis pedis pulses are 2+ on the right side and 2+ on the left side.       Posterior tibial pulses are 2+ on the right side and 2+ on the left side.     Heart sounds: No murmur heard. Pulmonary:     Effort: Pulmonary effort is normal. No respiratory distress.     Breath sounds: Normal breath sounds.  Abdominal:     Palpations: Abdomen is soft. There is no hepatomegaly or mass.     Tenderness: There is no abdominal tenderness.  Genitourinary:    Comments: Deferred to gyn. Musculoskeletal:     Comments: No major deformity or signs of synovitis appreciated.  Lymphadenopathy:     Cervical: No cervical adenopathy.     Upper Body:     Right upper body: No supraclavicular adenopathy.     Left upper body: No supraclavicular adenopathy.  Skin:    General: Skin is warm.     Findings: No erythema or rash.  Neurological:     General: No focal deficit present.     Mental Status: She is alert and oriented to person, place, and time.     Cranial Nerves: No cranial nerve deficit.     Coordination: Coordination normal.     Gait: Gait normal.     Deep Tendon Reflexes:     Reflex Scores:      Bicep reflexes are 2+ on the right side and 2+ on the left side.      Patellar reflexes are 2+ on the right side and 2+ on the left side. Psychiatric:     Comments: Well groomed, good eye contact.   ASSESSMENT AND PLAN: Jeanne Payne Payne was here today annual physical examination.  No orders of the defined types  were placed in this encounter.   There are no diagnoses linked to  this encounter.  There are no diagnoses linked to this encounter.  No follow-ups on file.  Gabrial Domine G. [place], MD  [facility]. [place] office.

## 2022-09-11 ENCOUNTER — Encounter: Payer: Self-pay | Admitting: Family Medicine

## 2022-09-11 ENCOUNTER — Ambulatory Visit: Payer: Managed Care, Other (non HMO) | Admitting: Family Medicine

## 2022-09-11 VITALS — BP 128/80 | HR 88 | Temp 98.1°F | Resp 12 | Ht 66.0 in | Wt 205.4 lb

## 2022-09-11 DIAGNOSIS — E559 Vitamin D deficiency, unspecified: Secondary | ICD-10-CM | POA: Diagnosis not present

## 2022-09-11 DIAGNOSIS — Z1329 Encounter for screening for other suspected endocrine disorder: Secondary | ICD-10-CM

## 2022-09-11 DIAGNOSIS — E785 Hyperlipidemia, unspecified: Secondary | ICD-10-CM | POA: Diagnosis not present

## 2022-09-11 DIAGNOSIS — Z13228 Encounter for screening for other metabolic disorders: Secondary | ICD-10-CM

## 2022-09-11 DIAGNOSIS — Z130 Encounter for screening for diseases of the blood and blood-forming organs and certain disorders involving the immune mechanism: Secondary | ICD-10-CM

## 2022-09-11 DIAGNOSIS — M545 Low back pain, unspecified: Secondary | ICD-10-CM

## 2022-09-11 DIAGNOSIS — Z0000 Encounter for general adult medical examination without abnormal findings: Secondary | ICD-10-CM | POA: Diagnosis not present

## 2022-09-11 DIAGNOSIS — F419 Anxiety disorder, unspecified: Secondary | ICD-10-CM

## 2022-09-11 LAB — VITAMIN D 25 HYDROXY (VIT D DEFICIENCY, FRACTURES): VITD: 32.42 ng/mL (ref 30.00–100.00)

## 2022-09-11 LAB — URINALYSIS, ROUTINE W REFLEX MICROSCOPIC
Bilirubin Urine: NEGATIVE
Hgb urine dipstick: NEGATIVE
Ketones, ur: NEGATIVE
Nitrite: NEGATIVE
Specific Gravity, Urine: 1.025 (ref 1.000–1.030)
Total Protein, Urine: NEGATIVE
Urine Glucose: NEGATIVE
Urobilinogen, UA: 0.2 (ref 0.0–1.0)
pH: 6 (ref 5.0–8.0)

## 2022-09-11 LAB — BASIC METABOLIC PANEL
BUN: 16 mg/dL (ref 6–23)
CO2: 27 mEq/L (ref 19–32)
Calcium: 9.9 mg/dL (ref 8.4–10.5)
Chloride: 101 mEq/L (ref 96–112)
Creatinine, Ser: 0.95 mg/dL (ref 0.40–1.20)
GFR: 69.46 mL/min (ref >60.00)
Glucose, Bld: 94 mg/dL (ref 70–99)
Potassium: 4.5 mEq/L (ref 3.5–5.1)
Sodium: 138 mEq/L (ref 135–145)

## 2022-09-11 LAB — LIPID PANEL
Cholesterol: 244 mg/dL — ABNORMAL HIGH (ref 0–200)
HDL: 47 mg/dL (ref >39.00)
LDL Cholesterol: 159 mg/dL — ABNORMAL HIGH (ref 0–99)
NonHDL: 197.44
Total CHOL/HDL Ratio: 5
Triglycerides: 191 mg/dL — ABNORMAL HIGH (ref 0.0–149.0)
VLDL: 38.2 mg/dL (ref 0.0–40.0)

## 2022-09-11 LAB — HEMOGLOBIN A1C: Hgb A1c MFr Bld: 5.8 % (ref 4.6–6.5)

## 2022-09-11 MED ORDER — ESCITALOPRAM OXALATE 20 MG PO TABS: ORAL_TABLET | ORAL | 2 refills | Status: DC

## 2022-09-11 NOTE — Patient Instructions (Addendum)
A few things to remember from today's visit:  Routine general medical examination at a health care facility  Hyperlipidemia, unspecified hyperlipidemia type - Plan: Lipid panel  Screening for endocrine, metabolic and immunity disorder - Plan: Hemoglobin A1c  Vitamin D deficiency, unspecified - Plan: Basic metabolic panel, VITAMIN D 25 Hydroxy (Vit-D Deficiency, Fractures)  Acute bilateral low back pain without sciatica - Plan: Urinalysis, Routine w reflex microscopic  Anxiety disorder, unspecified type - Plan: escitalopram (LEXAPRO) 20 MG tablet Arrange appt with your gynecologist and your need your mammogram. In regard to back pain, chiropractor or PT will help.  If you need refills for medications you take chronically, please call your pharmacy. Do not use My Chart to request refills or for acute issues that need immediate attention. If you send a my chart message, it may take a few days to be addressed, specially if I am not in the office.  Please be sure medication list is accurate. If a new problem present, please set up appointment sooner than planned today.  Health Maintenance, Female Adopting a healthy lifestyle and getting preventive care are important in promoting health and wellness. Ask your health care provider about: The right schedule for you to have regular tests and exams. Things you can do on your own to prevent diseases and keep yourself healthy. What should I know about diet, weight, and exercise? Eat a healthy diet  Eat a diet that includes plenty of vegetables, fruits, low-fat dairy products, and lean protein. Do not eat a lot of foods that are high in solid fats, added sugars, or sodium. Maintain a healthy weight Body mass index (BMI) is used to identify weight problems. It estimates body fat based on height and weight. Your health care provider can help determine your BMI and help you achieve or maintain a healthy weight. Get regular exercise Get regular  exercise. This is one of the most important things you can do for your health. Most adults should: Exercise for at least 150 minutes each week. The exercise should increase your heart rate and make you sweat (moderate-intensity exercise). Do strengthening exercises at least twice a week. This is in addition to the moderate-intensity exercise. Spend less time sitting. Even light physical activity can be beneficial. Watch cholesterol and blood lipids Have your blood tested for lipids and cholesterol at 52 years of age, then have this test every 5 years. Have your cholesterol levels checked more often if: Your lipid or cholesterol levels are high. You are older than 52 years of age. You are at high risk for heart disease. What should I know about cancer screening? Depending on your health history and family history, you may need to have cancer screening at various ages. This may include screening for: Breast cancer. Cervical cancer. Colorectal cancer. Skin cancer. Lung cancer. What should I know about heart disease, diabetes, and high blood pressure? Blood pressure and heart disease High blood pressure causes heart disease and increases the risk of stroke. This is more likely to develop in people who have high blood pressure readings or are overweight. Have your blood pressure checked: Every 3-5 years if you are 82-61 years of age. Every year if you are 68 years old or older. Diabetes Have regular diabetes screenings. This checks your fasting blood sugar level. Have the screening done: Once every three years after age 60 if you are at a normal weight and have a low risk for diabetes. More often and at a younger age if you  are overweight or have a high risk for diabetes. What should I know about preventing infection? Hepatitis B If you have a higher risk for hepatitis B, you should be screened for this virus. Talk with your health care provider to find out if you are at risk for hepatitis B  infection. Hepatitis C Testing is recommended for: Everyone born from [birth year] through 1965. Anyone with known risk factors for hepatitis C. Sexually transmitted infections (STIs) Get screened for STIs, including gonorrhea and chlamydia, if: You are sexually active and are younger than 52 years of age. You are older than 53 years of age and your health care provider tells you that you are at risk for this type of infection. Your sexual activity has changed since you were last screened, and you are at increased risk for chlamydia or gonorrhea. Ask your health care provider if you are at risk. Ask your health care provider about whether you are at high risk for HIV. Your health care provider may recommend a prescription medicine to help prevent HIV infection. If you choose to take medicine to prevent HIV, you should first get tested for HIV. You should then be tested every 3 months for as long as you are taking the medicine. Pregnancy If you are about to stop having your period (premenopausal) and you may become pregnant, seek counseling before you get pregnant. Take 400 to 800 micrograms (mcg) of folic acid every day if you become pregnant. Ask for birth control (contraception) if you want to prevent pregnancy. Osteoporosis and menopause Osteoporosis is a disease in which the bones lose minerals and strength with aging. This can result in bone fractures. If you are 63 years old or older, or if you are at risk for osteoporosis and fractures, ask your health care provider if you should: Be screened for bone loss. Take a calcium or vitamin D supplement to lower your risk of fractures. Be given hormone replacement therapy (HRT) to treat symptoms of menopause. Follow these instructions at home: Alcohol use Do not drink alcohol if: Your health care provider tells you not to drink. You are pregnant, may be pregnant, or are planning to become pregnant. If you drink alcohol: Limit how much you have  to: 0-1 drink a day. Know how much alcohol is in your drink. In the [place] one drink equals one 12 oz bottle of beer (355 mL), one 5 oz glass of wine (148 mL), or one 1 oz glass of hard liquor (44 mL). Lifestyle Do not use any products that contain nicotine or tobacco. These products include cigarettes, chewing tobacco, and vaping devices, such as e-cigarettes. If you need help quitting, ask your health care provider. Do not use street drugs. Do not share needles. Ask your health care provider for help if you need support or information about quitting drugs. General instructions Schedule regular health, dental, and eye exams. Stay current with your vaccines. Tell your health care provider if: You often feel depressed. You have ever been abused or do not feel safe at home. Summary Adopting a healthy lifestyle and getting preventive care are important in promoting health and wellness. Follow your health care provider's instructions about healthy diet, exercising, and getting tested or screened for diseases. Follow your health care provider's instructions on monitoring your cholesterol and blood pressure. This information is not intended to replace advice given to you by your health care provider. Make sure you discuss any questions you have with your health care provider. Document Revised: 10/31/2020  Document Reviewed: 10/31/2020 Elsevier Patient Education  [place].

## 2022-09-11 NOTE — Assessment & Plan Note (Signed)
She is not taking vit D supplementation daily. Further recommendations according to 25 [place] vit D result.

## 2022-09-11 NOTE — Assessment & Plan Note (Signed)
We discussed the importance of regular physical activity and healthy diet for prevention of chronic illness and/or complications. Preventive guidelines reviewed. Vaccination: She prefers to hold on Shingrix vaccine.Due for Tdap 03/2023. Ca++ through her diet and vit D supplementation recommended. Continue female preventive care with ehr gyn, she is overdue for mammogram.. Next CPE in a year.

## 2022-09-11 NOTE — Assessment & Plan Note (Signed)
Non pharmacologic treatment recommended for now. Further recommendations will be given according to 10 years CVD risk score and lipid panel numbers. 

## 2022-09-11 NOTE — Assessment & Plan Note (Signed)
Stable otherwise. Continue Lexapro 20 mg daily. As far as problem does not get worse, she can continue annual follow ups.

## 2023-02-18 NOTE — Progress Notes (Unsigned)
   ACUTE VISIT No chief complaint on file.  HPI: Ms.Oliviana Kalla Zupko is a 52 y.o. female, who is here today complaining of *** HPI  Review of Systems See other pertinent positives and negatives in HPI.  Current Outpatient Medications on File Prior to Visit  Medication Sig Dispense Refill   escitalopram (LEXAPRO) 20 MG tablet TAKE 1 TABLET(20 MG) BY MOUTH DAILY 90 tablet 2   meclizine (ANTIVERT) 25 MG tablet Take 1 tablet (25 mg total) by mouth at bedtime as needed for dizziness. 30 tablet 0   SUMAtriptan (IMITREX) 100 MG tablet TAKE 1 TABLET(100 MG) BY MOUTH DAILY AS NEEDED FOR MIGRAINE 10 tablet 11   No current facility-administered medications on file prior to visit.    Past Medical History:  Diagnosis Date   Blood clot in vein    Blood Clot in right leg   Depression    Hyperlipidemia    Migraine    Allergies  Allergen Reactions   Benadryl [Diphenhydramine]     Restless legs    Social History   Socioeconomic History   Marital status: Married    Spouse name: Not on file   Number of children: 3   Years of education: Not on file   Highest education level: Not on file  Occupational History   Not on file  Tobacco Use   Smoking status: Former    Types: Cigarettes   Smokeless tobacco: Never  Vaping Use   Vaping status: Never Used  Substance and Sexual Activity   Alcohol use: No   Drug use: Never   Sexual activity: Yes    Birth control/protection: None  Other Topics Concern   Not on file  Social History Narrative   ** Merged History Encounter **    Right Handed   Lives in a one story home   Social Determinants of Health   Financial Resource Strain: Not on file  Food Insecurity: Not on file  Transportation Needs: Not on file  Physical Activity: Not on file  Stress: Not on file  Social Connections: Not on file    There were no vitals filed for this visit. There is no height or weight on file to calculate BMI.  Physical Exam  ASSESSMENT  AND PLAN: There are no diagnoses linked to this encounter.  No follow-ups on file.  Arielis Leonhart G. [place], MD  [facility]. [place] office.  Discharge Instructions   None

## 2023-02-19 ENCOUNTER — Encounter: Payer: Self-pay | Admitting: Family Medicine

## 2023-02-19 ENCOUNTER — Ambulatory Visit: Payer: Managed Care, Other (non HMO) | Admitting: Family Medicine

## 2023-02-19 VITALS — BP 126/80 | HR 85 | Temp 97.9°F | Resp 12 | Ht 66.0 in | Wt 183.4 lb

## 2023-02-19 DIAGNOSIS — F99 Mental disorder, not otherwise specified: Secondary | ICD-10-CM | POA: Diagnosis not present

## 2023-02-19 DIAGNOSIS — F419 Anxiety disorder, unspecified: Secondary | ICD-10-CM | POA: Diagnosis not present

## 2023-02-19 DIAGNOSIS — F4321 Adjustment disorder with depressed mood: Secondary | ICD-10-CM | POA: Diagnosis not present

## 2023-02-19 DIAGNOSIS — F5105 Insomnia due to other mental disorder: Secondary | ICD-10-CM

## 2023-02-19 MED ORDER — DOXEPIN HCL 10 MG/ML PO CONC: 3.0000 mg | Freq: Every evening | ORAL | 1 refills | Status: DC | PRN

## 2023-02-19 NOTE — Patient Instructions (Signed)
A few things to remember from today's visit:  Anxiety disorder, unspecified type  Insomnia due to other mental disorder - Plan: doxepin (SINEQUAN) 10 MG/ML solution Doxepin 0.3 ml 30-60 min before bedtime then increase to 0.6 in 5 days and 1 ml in 5 days if needed. No changes in Lexapro. Please let me know in 4 weeks if medication is helping.  If you need refills for medications you take chronically, please call your pharmacy. Do not use My Chart to request refills or for acute issues that need immediate attention. If you send a my chart message, it may take a few days to be addressed, specially if I am not in the office.  Please be sure medication list is accurate. If a new problem present, please set up appointment sooner than planned today.

## 2023-02-21 ENCOUNTER — Encounter: Payer: Self-pay | Admitting: Family Medicine

## 2023-02-21 NOTE — Assessment & Plan Note (Addendum)
We discussed a few pharmacologic options. She has tried Ambien and Trazodone in the past. She would like to try Doxepin, starting with 3 mg and titrating up to 6 mg and 10 mg every 5-7 days as needed. Some side effects discussed. Good sleep hygiene to continue. She will let me know if medication is helping in about 4 weeks. F/U in 3 months.

## 2023-02-21 NOTE — Assessment & Plan Note (Addendum)
Aggravated by the death of her father. Continue Lexapro 20 mg 1/2 tab daily. Consider CBT.

## 2023-03-06 ENCOUNTER — Ambulatory Visit: Payer: Managed Care, Other (non HMO) | Admitting: Neurology

## 2023-03-16 ENCOUNTER — Other Ambulatory Visit: Payer: Self-pay | Admitting: Neurology

## 2023-03-16 DIAGNOSIS — G43109 Migraine with aura, not intractable, without status migrainosus: Secondary | ICD-10-CM

## 2023-05-09 ENCOUNTER — Other Ambulatory Visit: Payer: Self-pay | Admitting: Family Medicine

## 2023-05-09 DIAGNOSIS — F5105 Insomnia due to other mental disorder: Secondary | ICD-10-CM

## 2023-05-22 ENCOUNTER — Ambulatory Visit: Payer: Managed Care, Other (non HMO) | Admitting: Family Medicine

## 2023-09-21 ENCOUNTER — Other Ambulatory Visit: Payer: Self-pay | Admitting: Family Medicine

## 2023-09-21 DIAGNOSIS — F419 Anxiety disorder, unspecified: Secondary | ICD-10-CM

## 2024-03-11 ENCOUNTER — Ambulatory Visit (INDEPENDENT_AMBULATORY_CARE_PROVIDER_SITE_OTHER): Admitting: Family Medicine

## 2024-03-11 ENCOUNTER — Ambulatory Visit: Payer: Self-pay | Admitting: Family Medicine

## 2024-03-11 ENCOUNTER — Encounter: Payer: Self-pay | Admitting: Family Medicine

## 2024-03-11 VITALS — BP 141/81 | HR 82 | Temp 98.2°F | Resp 16 | Ht 66.0 in | Wt 202.2 lb

## 2024-03-11 DIAGNOSIS — Z13228 Encounter for screening for other metabolic disorders: Secondary | ICD-10-CM

## 2024-03-11 DIAGNOSIS — Z0000 Encounter for general adult medical examination without abnormal findings: Secondary | ICD-10-CM

## 2024-03-11 DIAGNOSIS — E785 Hyperlipidemia, unspecified: Secondary | ICD-10-CM

## 2024-03-11 DIAGNOSIS — Z130 Encounter for screening for diseases of the blood and blood-forming organs and certain disorders involving the immune mechanism: Secondary | ICD-10-CM

## 2024-03-11 DIAGNOSIS — Z1239 Encounter for other screening for malignant neoplasm of breast: Secondary | ICD-10-CM

## 2024-03-11 DIAGNOSIS — F419 Anxiety disorder, unspecified: Secondary | ICD-10-CM

## 2024-03-11 DIAGNOSIS — R030 Elevated blood-pressure reading, without diagnosis of hypertension: Secondary | ICD-10-CM

## 2024-03-11 DIAGNOSIS — Z23 Encounter for immunization: Secondary | ICD-10-CM | POA: Diagnosis not present

## 2024-03-11 DIAGNOSIS — E559 Vitamin D deficiency, unspecified: Secondary | ICD-10-CM | POA: Diagnosis not present

## 2024-03-11 DIAGNOSIS — Z1329 Encounter for screening for other suspected endocrine disorder: Secondary | ICD-10-CM | POA: Diagnosis not present

## 2024-03-11 LAB — HEMOGLOBIN A1C: Hgb A1c MFr Bld: 6 % (ref 4.6–6.5)

## 2024-03-11 LAB — COMPREHENSIVE METABOLIC PANEL WITH GFR
ALT: 23 U/L (ref 0–35)
AST: 23 U/L (ref 0–37)
Albumin: 4.4 g/dL (ref 3.5–5.2)
Alkaline Phosphatase: 50 U/L (ref 39–117)
BUN: 10 mg/dL (ref 6–23)
CO2: 22 meq/L (ref 19–32)
Calcium: 9.7 mg/dL (ref 8.4–10.5)
Chloride: 104 meq/L (ref 96–112)
Creatinine, Ser: 0.76 mg/dL (ref 0.40–1.20)
GFR: 89.83 mL/min (ref >60.00)
Glucose, Bld: 86 mg/dL (ref 70–99)
Potassium: 3.7 meq/L (ref 3.5–5.1)
Sodium: 138 meq/L (ref 135–145)
Total Bilirubin: 0.4 mg/dL (ref 0.2–1.2)
Total Protein: 7.6 g/dL (ref 6.0–8.3)

## 2024-03-11 LAB — LIPID PANEL
Cholesterol: 253 mg/dL — ABNORMAL HIGH (ref 0–200)
HDL: 54 mg/dL (ref >39.00)
LDL Cholesterol: 173 mg/dL — ABNORMAL HIGH (ref 0–99)
NonHDL: 198.93
Total CHOL/HDL Ratio: 5
Triglycerides: 130 mg/dL (ref 0.0–149.0)
VLDL: 26 mg/dL (ref 0.0–40.0)

## 2024-03-11 LAB — TSH: TSH: 1.57 u[IU]/mL (ref 0.35–5.50)

## 2024-03-11 LAB — VITAMIN D 25 HYDROXY (VIT D DEFICIENCY, FRACTURES): VITD: 38.45 ng/mL (ref 30.00–100.00)

## 2024-03-11 MED ORDER — ESCITALOPRAM OXALATE 20 MG PO TABS: ORAL_TABLET | ORAL | 3 refills | Status: AC

## 2024-03-11 NOTE — Patient Instructions (Addendum)
 A few things to remember from today's visit:  Routine general medical examination at a health care facility  Anxiety disorder, unspecified type - Plan: escitalopram  (LEXAPRO ) 20 MG tablet  Hyperlipidemia, unspecified hyperlipidemia type - Plan: Comprehensive metabolic panel with GFR, Lipid panel  Vitamin D  deficiency, unspecified  Screening for endocrine, metabolic and immunity disorder - Plan: Comprehensive metabolic panel with GFR, Hemoglobin A1c  Encounter for screening for malignant neoplasm of breast, unspecified screening modality - Plan: MM DIGITAL SCREENING BILATERAL  No changes today. Monitor blood pressue at home, it should be at or under 130/80. If persistently elevated at home, please let us  know.  If you need refills for medications you take chronically, please call your pharmacy. Do not use My Chart to request refills or for acute issues that need immediate attention. If you send a my chart message, it may take a few days to be addressed, specially if I am not in the office.  Please be sure medication list is accurate. If a new problem present, please set up appointment sooner than planned today.  Health Maintenance, Female Adopting a healthy lifestyle and getting preventive care are important in promoting health and wellness. Ask your health care provider about: The right schedule for you to have regular tests and exams. Things you can do on your own to prevent diseases and keep yourself healthy. What should I know about diet, weight, and exercise? Eat a healthy diet  Eat a diet that includes plenty of vegetables, fruits, low-fat dairy products, and lean protein. Do not eat a lot of foods that are high in solid fats, added sugars, or sodium. Maintain a healthy weight Body mass index (BMI) is used to identify weight problems. It estimates body fat based on height and weight. Your health care provider can help determine your BMI and help you achieve or maintain a  healthy weight. Get regular exercise Get regular exercise. This is one of the most important things you can do for your health. Most adults should: Exercise for at least 150 minutes each week. The exercise should increase your heart rate and make you sweat (moderate-intensity exercise). Do strengthening exercises at least twice a week. This is in addition to the moderate-intensity exercise. Spend less time sitting. Even light physical activity can be beneficial. Watch cholesterol and blood lipids Have your blood tested for lipids and cholesterol at 53 years of age, then have this test every 5 years. Have your cholesterol levels checked more often if: Your lipid or cholesterol levels are high. You are older than 53 years of age. You are at high risk for heart disease. What should I know about cancer screening? Depending on your health history and family history, you may need to have cancer screening at various ages. This may include screening for: Breast cancer. Cervical cancer. Colorectal cancer. Skin cancer. Lung cancer. What should I know about heart disease, diabetes, and high blood pressure? Blood pressure and heart disease High blood pressure causes heart disease and increases the risk of stroke. This is more likely to develop in people who have high blood pressure readings or are overweight. Have your blood pressure checked: Every 3-5 years if you are 45-88 years of age. Every year if you are 62 years old or older. Diabetes Have regular diabetes screenings. This checks your fasting blood sugar level. Have the screening done: Once every three years after age 51 if you are at a normal weight and have a low risk for diabetes. More often and  at a younger age if you are overweight or have a high risk for diabetes. What should I know about preventing infection? Hepatitis B If you have a higher risk for hepatitis B, you should be screened for this virus. Talk with your health care  provider to find out if you are at risk for hepatitis B infection. Hepatitis C Testing is recommended for: Everyone born from [birth year] through 1965. Anyone with known risk factors for hepatitis C. Sexually transmitted infections (STIs) Get screened for STIs, including gonorrhea and chlamydia, if: You are sexually active and are younger than 53 years of age. You are older than 53 years of age and your health care provider tells you that you are at risk for this type of infection. Your sexual activity has changed since you were last screened, and you are at increased risk for chlamydia or gonorrhea. Ask your health care provider if you are at risk. Ask your health care provider about whether you are at high risk for HIV. Your health care provider may recommend a prescription medicine to help prevent HIV infection. If you choose to take medicine to prevent HIV, you should first get tested for HIV. You should then be tested every 3 months for as long as you are taking the medicine. Pregnancy If you are about to stop having your period (premenopausal) and you may become pregnant, seek counseling before you get pregnant. Take 400 to 800 micrograms (mcg) of folic acid every day if you become pregnant. Ask for birth control (contraception) if you want to prevent pregnancy. Osteoporosis and menopause Osteoporosis is a disease in which the bones lose minerals and strength with aging. This can result in bone fractures. If you are 41 years old or older, or if you are at risk for osteoporosis and fractures, ask your health care provider if you should: Be screened for bone loss. Take a calcium or vitamin D  supplement to lower your risk of fractures. Be given hormone replacement therapy (HRT) to treat symptoms of menopause. Follow these instructions at home: Alcohol use Do not drink alcohol if: Your health care provider tells you not to drink. You are pregnant, may be pregnant, or are planning to become  pregnant. If you drink alcohol: Limit how much you have to: 0-1 drink a day. Know how much alcohol is in your drink. In the [place] one drink equals one 12 oz bottle of beer (355 mL), one 5 oz glass of wine (148 mL), or one 1 oz glass of hard liquor (44 mL). Lifestyle Do not use any products that contain nicotine or tobacco. These products include cigarettes, chewing tobacco, and vaping devices, such as e-cigarettes. If you need help quitting, ask your health care provider. Do not use street drugs. Do not share needles. Ask your health care provider for help if you need support or information about quitting drugs. General instructions Schedule regular health, dental, and eye exams. Stay current with your vaccines. Tell your health care provider if: You often feel depressed. You have ever been abused or do not feel safe at home. Summary Adopting a healthy lifestyle and getting preventive care are important in promoting health and wellness. Follow your health care provider's instructions about healthy diet, exercising, and getting tested or screened for diseases. Follow your health care provider's instructions on monitoring your cholesterol and blood pressure. This information is not intended to replace advice given to you by your health care provider. Make sure you discuss any questions you have with your  health care provider. Document Revised: 10/31/2020 Document Reviewed: 10/31/2020 Elsevier Patient Education  2024 [employer].

## 2024-03-11 NOTE — Assessment & Plan Note (Addendum)
 We discussed the importance of regular physical activity and healthy diet for prevention of chronic illness and/or complications. Preventive guidelines reviewed. Vaccination: Tdap given today after verbal consent, she prefers to hold on Prevnar 20 and influenza vaccine. Ca++ and vit D supplementation to continue. Reports Pap smear as current, 2022.  Continue female preventive care with gynecologist. Referral for mammogram placed. Next CPE in a year.

## 2024-03-11 NOTE — Progress Notes (Signed)
 Chief Complaint  Patient presents with   Annual Exam    Pt is fasting   Discussed the use of AI scribe software for clinical note transcription with the patient, who gave verbal consent to proceed.  History of Present Illness Jeanne Payne is a 53 year old female with PMHx significant for anxiety, chronic fatigue, GERD, vit D def,HLD, and migraine headaches who presents for an annual physical exam.  She is active five out of seven days, primarily through caring for her grandchildren and mother, but lacks a regular exercise routine.  Her diet includes home-cooked meals and dining out, but she does not consume vegetables daily.  She sleeps five to seven hours per night, often with interruptions.  She does not consume alcohol and quit smoking in October 2024 after intermittent use since age 37.  She has her last eye exam in March 2024.  Reports her last Pap smear was in August 2022.   Since January, she has experienced frequent illnesses, including strep throat, pneumonia, and ear infections, which she attributes to exposure from her grandchildren. Her ear infections were treated with drops, antibiotics, and Augmentin, resolving the issue, though she occasionally feels pressure in her ears. She has seen ENT, last on 02/14/24.   Immunization History  Administered Date(s) Administered   Influenza-Unspecified 05/27/2015   Tdap 04/16/2013   Health Maintenance  Topic Date Due   Mammogram  02/16/2022   COVID-19 Vaccine (1 - 2024-25 season) 03/27/2024 (Originally 02/24/2024)   Zoster Vaccines- Shingrix (1 of 2) 06/10/2024 (Originally 05/11/2021)   Influenza Vaccine  09/22/2024 (Originally 01/24/2024)   Cervical Cancer Screening (HPV/Pap Cotest)  03/11/2025 (Originally 05/11/2001)   DTaP/Tdap/Td (2 - Td or Tdap) 03/11/2025 (Originally 04/17/2023)   Pneumococcal Vaccine: 50+ Years (1 of 1 - PCV) 03/11/2025 (Originally 05/11/2021)   Hepatitis B Vaccines 19-59 Average Risk (1 of 3  - 19+ 3-dose series) 03/11/2025 (Originally 05/11/1990)   HIV Screening  09/10/2028 (Originally 05/11/1986)   Colonoscopy  10/13/2030   Hepatitis C Screening  Completed   HPV VACCINES  Aged Out   Meningococcal B Vaccine  Aged Out   HLD on non pharmacologic treatment. Lab Results  Component Value Date   CHOL 244 (H) 09/11/2022   HDL 47.00 09/11/2022   LDLCALC 159 (H) 09/11/2022   TRIG 191.0 (H) 09/11/2022   CHOLHDL 5 09/11/2022   Lab Results  Component Value Date   HGBA1C 5.8 09/11/2022   She takes a women's multivitamin containing calcium and vitamin D .   Anxiety: She is currently taking Lexapro  20 mg daily, which has helped. No side effects.  Her BP mildly elevated today. No hx of HTN.  Review of Systems  Constitutional:  Positive for fatigue. Negative for activity change, appetite change and fever.  HENT:  Negative for ear discharge, mouth sores, sore throat and trouble swallowing.   Eyes:  Negative for redness and visual disturbance.  Respiratory:  Negative for cough, shortness of breath and wheezing.   Cardiovascular:  Negative for chest pain and leg swelling.  Gastrointestinal:  Negative for abdominal pain, nausea and vomiting.  Endocrine: Negative for cold intolerance, heat intolerance, polydipsia, polyphagia and polyuria.  Genitourinary:  Negative for decreased urine volume, dysuria and hematuria.  Musculoskeletal:  Negative for gait problem and myalgias.  Skin:  Negative for color change and rash.  Allergic/Immunologic: Positive for environmental allergies.  Neurological:  Negative for syncope, weakness and headaches.  Hematological:  Negative for adenopathy. Does not bruise/bleed easily.  Psychiatric/Behavioral:  Positive for sleep disturbance. Negative for confusion and hallucinations.   All other systems reviewed and are negative.  Current Outpatient Medications on File Prior to Visit  Medication Sig Dispense Refill   SUMAtriptan  (IMITREX ) 100 MG tablet TAKE 1  TABLET(100 MG) BY MOUTH DAILY AS NEEDED FOR MIGRAINE 10 tablet 2   meclizine  (ANTIVERT ) 25 MG tablet Take 1 tablet (25 mg total) by mouth at bedtime as needed for dizziness. (Patient not taking: Reported on 03/11/2024) 30 tablet 0   No current facility-administered medications on file prior to visit.   Past Medical History:  Diagnosis Date   Blood clot in vein    Blood Clot in right leg   Depression    Hyperlipidemia    Migraine    Past Surgical History:  Procedure Laterality Date   TUBAL LIGATION     Allergies  Allergen Reactions   Benadryl [Diphenhydramine]     Restless legs   Family History  Problem Relation Age of Onset   Hypertension Mother    Depression Mother    Alcohol abuse Father    Asthma Father    Cancer Father    COPD Father    Diabetes Father    Hearing loss Father    Heart disease Father    Colon polyps Father    Depression Sister    Early death Sister    Alcohol abuse Brother    Drug abuse Brother    Arthritis Paternal Grandmother    COPD Paternal Grandmother    Hearing loss Paternal Grandmother    Breast cancer Paternal Grandmother    Colon cancer Neg Hx    Esophageal cancer Neg Hx    Pancreatic cancer Neg Hx    Stomach cancer Neg Hx     Social History   Socioeconomic History   Marital status: Married    Spouse name: Not on file   Number of children: 3   Years of education: Not on file   Highest education level: Associate degree: academic program  Occupational History   Not on file  Tobacco Use   Smoking status: Former    Types: Cigarettes   Smokeless tobacco: Never  Vaping Use   Vaping status: Never Used  Substance and Sexual Activity   Alcohol use: No   Drug use: Never   Sexual activity: Yes    Birth control/protection: None  Other Topics Concern   Not on file  Social History Narrative   ** Merged History Encounter **    Right Handed   Lives in a one story home   Social Drivers of Health   Financial Resource Strain: Low  Risk  (03/09/2024)   Overall Financial Resource Strain (CARDIA)    Difficulty of Paying Living Expenses: Not very hard  Food Insecurity: No Food Insecurity (03/09/2024)   Hunger Vital Sign    Worried About Running Out of Food in the Last Year: Never true    Ran Out of Food in the Last Year: Never true  Transportation Needs: No Transportation Needs (03/09/2024)   PRAPARE - Administrator, Civil Service (Medical): No    Lack of Transportation (Non-Medical): No  Physical Activity: Sufficiently Active (03/09/2024)   Exercise Vital Sign    Days of Exercise per Week: 4 days    Minutes of Exercise per Session: 60 min  Stress: Stress Concern Present (03/09/2024)   [employer] of Occupational Health - Occupational Stress Questionnaire    Feeling of Stress: To some extent  Social Connections: Socially Integrated (03/09/2024)   Social Connection and Isolation Panel    Frequency of Communication with Friends and Family: More than three times a week    Frequency of Social Gatherings with Friends and Family: Three times a week    Attends Religious Services: More than 4 times per year    Active Member of Clubs or Organizations: Yes    Attends Banker Meetings: More than 4 times per year    Marital Status: Married    Vitals:   03/11/24 1034 03/11/24 1037  BP: (!) 138/98 (!) 141/81  Pulse: 82   Resp: 16   Temp: 98.2 F (36.8 C)   SpO2: 98%    Body mass index is 32.64 kg/m.  Wt Readings from Last 3 Encounters:  03/11/24 202 lb 3.2 oz (91.7 kg)  02/19/23 183 lb 6 oz (83.2 kg)  09/11/22 205 lb 6 oz (93.2 kg)   Physical Exam Vitals and nursing note reviewed.  Constitutional:      General: She is not in acute distress.    Appearance: She is well-developed.  HENT:     Head: Normocephalic and atraumatic.     Right Ear: Tympanic membrane, ear canal and external ear normal.     Left Ear: Tympanic membrane, ear canal and external ear normal.     Mouth/Throat:      Mouth: Mucous membranes are moist.     Pharynx: Oropharynx is clear. Uvula midline.  Eyes:     Extraocular Movements: Extraocular movements intact.     Conjunctiva/sclera: Conjunctivae normal.     Pupils: Pupils are equal, round, and reactive to light.  Neck:     Thyroid : No thyroid  mass or thyromegaly.  Cardiovascular:     Rate and Rhythm: Normal rate and regular rhythm.     Pulses:          Dorsalis pedis pulses are 2+ on the right side and 2+ on the left side.     Heart sounds: No murmur heard. Pulmonary:     Effort: Pulmonary effort is normal. No respiratory distress.     Breath sounds: Normal breath sounds.  Abdominal:     Palpations: Abdomen is soft. There is no hepatomegaly or mass.     Tenderness: There is no abdominal tenderness.  Genitourinary:    Comments: Deferred to gyn. Musculoskeletal:     Comments: No major deformity or signs of synovitis appreciated.  Lymphadenopathy:     Cervical: No cervical adenopathy.     Upper Body:     Right upper body: No supraclavicular adenopathy.     Left upper body: No supraclavicular adenopathy.  Skin:    General: Skin is warm.     Findings: No erythema or rash.  Neurological:     General: No focal deficit present.     Mental Status: She is alert and oriented to person, place, and time.     Cranial Nerves: No cranial nerve deficit.     Coordination: Coordination normal.     Gait: Gait normal.     Deep Tendon Reflexes:     Reflex Scores:      Bicep reflexes are 2+ on the right side and 2+ on the left side.      Patellar reflexes are 2+ on the right side and 2+ on the left side. Psychiatric:        Mood and Affect: Affect normal. Mood is anxious.   ASSESSMENT AND PLAN: Ms. Jeanne Payne [facility] was  here today annual physical examination.  Orders Placed This Encounter  Procedures   MM DIGITAL SCREENING BILATERAL   Tdap vaccine greater than or equal to 7yo IM   Comprehensive metabolic panel with GFR   Hemoglobin A1c    Lipid panel   Lab Results  Component Value Date   CHOL 253 (H) 03/11/2024   HDL 54.00 03/11/2024   LDLCALC 173 (H) 03/11/2024   TRIG 130.0 03/11/2024   CHOLHDL 5 03/11/2024   Lab Results  Component Value Date   HGBA1C 6.0 03/11/2024   Lab Results  Component Value Date   NA 138 03/11/2024   CL 104 03/11/2024   K 3.7 03/11/2024   CO2 22 03/11/2024   BUN 10 03/11/2024   CREATININE 0.76 03/11/2024   GFR 89.83 03/11/2024   CALCIUM 9.7 03/11/2024   ALBUMIN 4.4 03/11/2024   GLUCOSE 86 03/11/2024   Lab Results  Component Value Date   ALT 23 03/11/2024   AST 23 03/11/2024   ALKPHOS 50 03/11/2024   BILITOT 0.4 03/11/2024   Lab Results  Component Value Date   VD25OH 38.45 03/11/2024   The 10-year ASCVD risk score (Arnett DK, et al., 2019) is: 2.4%   Values used to calculate the score:     Age: 22 years     Clincally relevant sex: Female     Is Non-Hispanic African American: No     Diabetic: No     Tobacco smoker: No     Systolic Blood Pressure: 141 mmHg     Is BP treated: No     HDL Cholesterol: 54 mg/dL     Total Cholesterol: 253 mg/dL  Routine general medical examination at a health care facility Assessment & Plan: We discussed the importance of regular physical activity and healthy diet for prevention of chronic illness and/or complications. Preventive guidelines reviewed. Vaccination: Tdap given today after verbal consent, she prefers to hold on Prevnar 20 and influenza vaccine. Ca++ and vit D supplementation to continue. Reports Pap smear as current, 2022.  Continue female preventive care with gynecologist. Referral for mammogram placed. Next CPE in a year.  Anxiety disorder, unspecified type Assessment & Plan: Problem is adequately controlled and stable on current management. Continue Lexapro  20 mg daily. As far as problem is stable, annual follow-ups can be continued.  Orders: -     Escitalopram  Oxalate; TAKE 1 TABLET(20 MG) BY MOUTH DAILY. Please  arrange follow up prior to running out of medication.  Dispense: 90 tablet; Refill: 3  Hyperlipidemia, unspecified hyperlipidemia type Assessment & Plan: Currently on no pharmacologic treatment. Further recommendation will be given according to lipid panel result.  Orders: -     Comprehensive metabolic panel with GFR -     Lipid panel  Vitamin D  deficiency, unspecified Assessment & Plan: Currently she is on vitamin D  supplementation in her daily multivitamin. Further recommendation will be given according to 25 OH vitamin D  result.  Screening for endocrine, metabolic and immunity disorder -     Comprehensive metabolic panel with GFR -     Hemoglobin A1c  Encounter for screening for malignant neoplasm of breast, unspecified screening modality -     Digital Screening Mammogram, Left and Right; Future  Elevated blood pressure reading No history of hypertension. Recommend monitoring BP at home twice daily for 2 weeks and to let me know how her readings.  Need for Tdap vaccination -     Tdap vaccine greater than or equal to 7yo IM  Return  in 1 year (on 03/11/2025) for CPE, chronic problems.  Levelle Edelen G. [place], MD  [facility]. [place] office.

## 2024-03-11 NOTE — Assessment & Plan Note (Signed)
 Problem is adequately controlled and stable on current management. Continue Lexapro  20 mg daily. As far as problem is stable, annual follow-ups can be continued.

## 2024-03-11 NOTE — Assessment & Plan Note (Signed)
 Currently she is on vitamin D  supplementation in her daily multivitamin. Further recommendation will be given according to 25 OH vitamin D  result.

## 2024-03-11 NOTE — Assessment & Plan Note (Signed)
 Currently on no pharmacologic treatment. Further recommendation will be given according to lipid panel result.

## 2024-03-16 MED ORDER — ROSUVASTATIN CALCIUM 10 MG PO TABS: 10.0000 mg | ORAL_TABLET | Freq: Every day | ORAL | 3 refills | Status: AC

## 2024-03-16 NOTE — Telephone Encounter (Signed)
 Spoke with patient regarding lab results and Dr. Gib recommendations. Questions answered. Patient voiced understanding. Appointment made for future labs. Medication sent to pharmacy.

## 2024-03-27 ENCOUNTER — Telehealth: Payer: Self-pay | Admitting: *Deleted

## 2024-03-27 NOTE — Telephone Encounter (Signed)
 Copied from CRM [id number]. Topic: Clinical - Medical Advice >> Mar 27, 2024 11:06 AM Thersia BROCKS wrote: Reason for CRM: Patient has been taking blood pressure everyday for 2 weeks  The highest monday 162/96  the average was between 155/90 136/77 this morning 10/03 147/86  didnt do it yesterday

## 2024-03-28 ENCOUNTER — Other Ambulatory Visit: Payer: Self-pay | Admitting: Family Medicine

## 2024-03-28 DIAGNOSIS — I10 Essential (primary) hypertension: Secondary | ICD-10-CM | POA: Insufficient documentation

## 2024-03-28 MED ORDER — AMLODIPINE BESYLATE 5 MG PO TABS: 5.0000 mg | ORAL_TABLET | Freq: Every day | ORAL | 0 refills | Status: DC

## 2024-03-28 NOTE — Telephone Encounter (Signed)
 MyChart message sent. Amlodipine recommended. 3 months f/u. Thanks, BJ

## 2024-05-04 ENCOUNTER — Other Ambulatory Visit

## 2024-06-01 ENCOUNTER — Emergency Department (HOSPITAL_BASED_OUTPATIENT_CLINIC_OR_DEPARTMENT_OTHER)
Admission: EM | Admit: 2024-06-01 | Discharge: 2024-06-01 | Disposition: A | Discharge status: Home / Self Care | Attending: Emergency Medicine | Admitting: Emergency Medicine

## 2024-06-01 ENCOUNTER — Encounter (HOSPITAL_BASED_OUTPATIENT_CLINIC_OR_DEPARTMENT_OTHER): Payer: Self-pay

## 2024-06-01 ENCOUNTER — Other Ambulatory Visit: Payer: Self-pay

## 2024-06-01 DIAGNOSIS — B9689 Other specified bacterial agents as the cause of diseases classified elsewhere: Secondary | ICD-10-CM

## 2024-06-01 HISTORY — DX: Essential (primary) hypertension: I10

## 2024-06-01 LAB — URINALYSIS, ROUTINE W REFLEX MICROSCOPIC
Bilirubin Urine: NEGATIVE
Glucose, UA: NEGATIVE mg/dL
Hgb urine dipstick: NEGATIVE
Ketones, ur: NEGATIVE mg/dL
Leukocytes,Ua: NEGATIVE
Nitrite: NEGATIVE
Protein, ur: NEGATIVE mg/dL
Specific Gravity, Urine: 1.018 (ref 1.005–1.030)
pH: 8 (ref 5.0–8.0)

## 2024-06-01 LAB — WET PREP, GENITAL
Sperm: NONE SEEN
Trich, Wet Prep: NONE SEEN
WBC, Wet Prep HPF POC: <10 (ref <10)
Yeast Wet Prep HPF POC: NONE SEEN

## 2024-06-01 MED ORDER — METRONIDAZOLE 500 MG PO TABS: 500.0000 mg | ORAL_TABLET | Freq: Two times a day (BID) | ORAL | 0 refills | Status: AC

## 2024-06-01 NOTE — Discharge Instructions (Signed)
 You tested positive for bacterial vaginosis which is an overgrowth of the normal vaginal bacteria. This is not a sexually transmitted infection.   Your testing for yeast infection was negative.  Your urine did not show any signs of infection.  Medications:  You have been prescribed an antibiotic called metronidazole  to treat this infection. Please take this medication twice daily for the next 7 days. Take the full course of the antibiotic even if you start feeling better. Avoid alcohol when taking this medication as it can cause severe vomiting.  Follow up: Please follow-up with your PCP within the next week if symptoms not improving.  Return instructions:  Please return to the Emergency Department if you experience worsening symptoms.  Please return if you have any other emergent concerns.

## 2024-06-01 NOTE — ED Triage Notes (Signed)
 Urinary frequency, urinary urgency, for several weeks. Dysuria today. Last urinated 2 hrs prior with small BM. No prior catheter use.

## 2024-06-01 NOTE — ED Provider Notes (Signed)
 [facility] EM[facility] AT [facility] Provider Note   CSN: [id number] Arrival date & time: 06/01/24  1143     Patient presents with: Urinary Retention   Jeanne Payne is a 53 y.o. female with history of hypertension, presents with concern for urinary frequency for the past couple weeks.  She reports that she had onset of dysuria yesterday and today.  Denies any abnormal vaginal discharge.  Reports an episode of lower abdominal pain with the dysuria earlier, but no current abdominal pain.  Reports normal bowel movements with the last bowel movement being today.  No nausea or vomiting. No flank pain or fevers.    HPI     Prior to Admission medications   Medication Sig Start Date End Date Taking? Authorizing Provider  metroNIDAZOLE  (FLAGYL ) 500 MG tablet Take 1 tablet (500 mg total) by mouth 2 (two) times daily for 7 days. 06/01/24 06/08/24 Yes Veta Palma, PA-C  amLODipine  (NORVASC ) 5 MG tablet Take 1 tablet (5 mg total) by mouth daily. 03/28/24   Jordan, Betty G, MD  escitalopram  (LEXAPRO ) 20 MG tablet TAKE 1 TABLET(20 MG) BY MOUTH DAILY. Please arrange follow up prior to running out of medication. 03/11/24   Jordan, Betty G, MD  meclizine  (ANTIVERT ) 25 MG tablet Take 1 tablet (25 mg total) by mouth at bedtime as needed for dizziness. Patient not taking: Reported on 03/11/2024 05/25/22   Jordan, Betty G, MD  rosuvastatin  (CRESTOR ) 10 MG tablet Take 1 tablet (10 mg total) by mouth daily. 03/16/24   Jordan, Betty G, MD  SUMAtriptan  (IMITREX ) 100 MG tablet TAKE 1 TABLET(100 MG) BY MOUTH DAILY AS NEEDED FOR MIGRAINE 03/18/23   Patel, Donika K, DO    Allergies: Benadryl [diphenhydramine]    Review of Systems  Genitourinary:  Positive for urgency.    Updated Vital Signs BP (!) 141/87 (BP Location: Right Arm)   Pulse 94   Temp 98.6 F (37 C) (Oral)   Resp 16   SpO2 98%   Physical Exam Vitals and nursing note reviewed. Exam conducted with a chaperone  present.  Constitutional:      Appearance: Normal appearance.  HENT:     Head: Atraumatic.  Cardiovascular:     Rate and Rhythm: Normal rate and regular rhythm.  Pulmonary:     Effort: Pulmonary effort is normal.  Abdominal:     Palpations: Abdomen is soft.     Tenderness: There is no abdominal tenderness.     Comments: No CVA tenderness to palpation bilaterally  Genitourinary:    Comments: NT Paige present to chaperone pelvic exam  Patient with erythematous appearing skin at the vaginal opening. No abnormal vaginal discharge noted. Patient declines full internal exam Musculoskeletal:     Comments: Lumbar muscle tenderness bilaterally  Skin:    Findings: No rash.  Neurological:     General: No focal deficit present.     Mental Status: She is alert.  Psychiatric:        Mood and Affect: Mood normal.        Behavior: Behavior normal.     (all labs ordered are listed, but only abnormal results are displayed) Labs Reviewed  WET PREP, GENITAL - Abnormal; Notable for the following components:      Result Value   Clue Cells Wet Prep HPF POC PRESENT (*)    All other components within normal limits  URINALYSIS, ROUTINE W REFLEX MICROSCOPIC    EKG: None  Radiology: No results found.  Procedures   Medications Ordered in the ED - No data to display                                  Medical Decision Making Amount and/or Complexity of Data Reviewed Labs: ordered.  Risk Prescription drug management.     Differential diagnosis includes but is not limited to UTI, pyelonephritis, overactive bladder, bladder irritants, yeast infection, ureteritis   ED Course:  Upon initial evaluation, patient is well-appearing, normal vitals aside from her elevated blood pressure of 141/87.  Abdomen is soft nontender.  No CVA tenderness. Bladder scan was performed and this showed 0 mL of urine in the bladder, no concern for acute urinary retention.  Her urine is without any signs of  infection, doubt UTI.  With NT chaperone present, external pelvic exam was performed.  Patient had erythematous appearing skin of the external vaginal canal. Patient declines any internal exam.  Wet prep was positive for bacterial vaginosis.  Negative for yeast infection or trichomoniasis.  Suspect the BV is the cause of her symptoms today.  Will start course of metronidazole . I did discuss with the patient that the increased frequency could be a sign of overactive bladder or consuming bladder irritants and recommended close follow up with a PCP or urologist. Patient stable and appropriate for discharge home.  Labs Ordered: I Ordered, and personally interpreted labs.  The pertinent results include:   Urinalysis within normal limits, no signs of infection Wet prep positive for clue cells   Medications Given: None, patient would like to get metronidazole  at her pharmacy  Impression: Bacterial vaginosis   Disposition:  The patient was discharged home with instructions to take 7-day course of metronidazole  as prescribed.  Follow-up with PCP within the next week for recheck of symptoms. Return precautions given and patient verbalized understanding.   This chart was dictated using voice recognition software, Dragon. Despite the best efforts of this provider to proofread and correct errors, errors may still occur which can change documentation meaning.       Final diagnoses:  Bacterial vaginosis    ED Discharge Orders          Ordered    metroNIDAZOLE  (FLAGYL ) 500 MG tablet  2 times daily        06/01/24 1541               Veta Palma, PA-C 06/01/24 1547    Jerrol Agent, MD 06/01/24 [phone]

## 2024-06-01 NOTE — ED Notes (Signed)
 Attempted bladder scan in Triage initially. Scanner was not charged.

## 2024-06-30 ENCOUNTER — Ambulatory Visit: Payer: Self-pay | Admitting: Family Medicine

## 2024-06-30 ENCOUNTER — Ambulatory Visit: Admitting: Family Medicine

## 2024-06-30 ENCOUNTER — Encounter: Payer: Self-pay | Admitting: Family Medicine

## 2024-06-30 VITALS — BP 140/80 | HR 76 | Temp 98.2°F | Resp 16 | Ht 66.0 in | Wt 198.8 lb

## 2024-06-30 DIAGNOSIS — I10 Essential (primary) hypertension: Secondary | ICD-10-CM | POA: Diagnosis not present

## 2024-06-30 DIAGNOSIS — E785 Hyperlipidemia, unspecified: Secondary | ICD-10-CM | POA: Diagnosis not present

## 2024-06-30 DIAGNOSIS — H60502 Unspecified acute noninfective otitis externa, left ear: Secondary | ICD-10-CM | POA: Diagnosis not present

## 2024-06-30 LAB — HEPATIC FUNCTION PANEL
ALT: 22 U/L (ref 3–35)
AST: 25 U/L (ref 5–37)
Albumin: 4.5 g/dL (ref 3.5–5.2)
Alkaline Phosphatase: 47 U/L (ref 39–117)
Bilirubin, Direct: 0.1 mg/dL (ref 0.1–0.3)
Total Bilirubin: 0.5 mg/dL (ref 0.2–1.2)
Total Protein: 7.5 g/dL (ref 6.0–8.3)

## 2024-06-30 LAB — LIPID PANEL
Cholesterol: 179 mg/dL (ref 28–200)
HDL: 46.6 mg/dL
LDL Cholesterol: 108 mg/dL — ABNORMAL HIGH (ref 10–99)
NonHDL: 132.49
Total CHOL/HDL Ratio: 4
Triglycerides: 123 mg/dL (ref 10.0–149.0)
VLDL: 24.6 mg/dL (ref 0.0–40.0)

## 2024-06-30 MED ORDER — VALSARTAN 80 MG PO TABS: 80.0000 mg | ORAL_TABLET | Freq: Every day | ORAL | 1 refills | Status: AC

## 2024-06-30 MED ORDER — CIPROFLOXACIN-DEXAMETHASONE 0.3-0.1 % OT SUSP: 4.0000 [drp] | Freq: Two times a day (BID) | OTIC | 0 refills | Status: AC

## 2024-06-30 MED ORDER — AMLODIPINE BESYLATE 5 MG PO TABS: 5.0000 mg | ORAL_TABLET | Freq: Every day | ORAL | 2 refills | Status: AC

## 2024-06-30 NOTE — Assessment & Plan Note (Signed)
Continue rosuvastatin 10 mg daily and low-fat diet. Further recommendation will be given according to lipid panel result. 

## 2024-06-30 NOTE — Progress Notes (Signed)
 "  Chief Complaint  Patient presents with   Hypertension    Labs for liver function overdue - patient willing to do labs today   Ear Pain    Patient reports having ear infections all summer- currently left ear is bothering her and swollen.     Discussed the use of AI scribe software for clinical note transcription with the patient, who gave verbal consent to proceed. History of Present Illness Jeanne Payne is a 54 year old female with a past medical history significant for hyperlipidemia, migraine headaches, GERD, anxiety, chronic fatigue, vitamin D  deficiency, and hypertension, who is here today for chronic disease management. Last seen on 03/11/2024.  She is experiencing left ear pain that began on Saturday. The pain initially started in her teeth and radiated to her ear, accompanied by a sensation of pressure and slight swelling behind the ear. She reports a history of recurrent ear infections over the summer, which were resolved with antibiotics by August or September. She has been taking Advil for the pain, which provides some relief. No recent cold, travel, ear injury, drainage, or significant changes in hearing (not sure), although she has experienced occasional morning congestion that resolves with activity. She had the flu in November/2025.  Hypertension: She is currently taking amlodipine  5 mg daily. Her blood pressure initially improved with medication, reaching as low as 130/70 mmHg on a few occasions. However, she has not monitored her blood pressure recently due to increased stress from caring for her mother, who has been in and out of the hospital since September. Negative for unusual or severe headache, visual changes, exertional chest pain, dyspnea,  focal weakness, or edema.  Lab Results  Component Value Date   CREATININE 0.76 03/11/2024   BUN 10 03/11/2024   NA 138 03/11/2024   K 3.7 03/11/2024   CL 104 03/11/2024   CO2 22 03/11/2024   Hyperlipidemia:  Currently she is on rosuvastatin  10 mg daily, which she has tolerated well. Lab Results  Component Value Date   CHOL 253 (H) 03/11/2024   HDL 54.00 03/11/2024   LDLCALC 173 (H) 03/11/2024   TRIG 130.0 03/11/2024   CHOLHDL 5 03/11/2024   Review of Systems  Constitutional:  Positive for fatigue. Negative for activity change, appetite change, chills and fever.  HENT:  Negative for facial swelling, mouth sores and sore throat.   Respiratory:  Negative for cough and wheezing.   Gastrointestinal:  Negative for abdominal pain, nausea and vomiting.  Genitourinary:  Negative for decreased urine volume and hematuria.  Skin:  Negative for rash.  Allergic/Immunologic: Positive for environmental allergies.  Neurological:  Negative for syncope and facial asymmetry.  Psychiatric/Behavioral:  Negative for confusion and hallucinations.   See other pertinent positives and negatives in HPI.  Medications Ordered Prior to Encounter[1]  Past Medical History:  Diagnosis Date   Blood clot in vein    Blood Clot in right leg   Depression    Hyperlipidemia    Hypertension    Migraine     Allergies[2]  Social History   Socioeconomic History   Marital status: Married    Spouse name: Not on file   Number of children: 3   Years of education: Not on file   Highest education level: Associate degree: academic program  Occupational History   Not on file  Tobacco Use   Smoking status: Former    Types: Cigarettes   Smokeless tobacco: Never  Vaping Use   Vaping status: Never Used  Substance and Sexual Activity   Alcohol use: No   Drug use: Never   Sexual activity: Yes    Birth control/protection: None  Other Topics Concern   Not on file  Social History Narrative   ** Merged History Encounter **    Right Handed   Lives in a one story home   Social Drivers of Health   Tobacco Use: Medium Risk (06/30/2024)   Patient History    Smoking Tobacco Use: Former    Smokeless Tobacco Use: Never     Passive Exposure: Not on Actuary Strain: Low Risk (06/29/2024)   Overall Financial Resource Strain (CARDIA)    Difficulty of Paying Living Expenses: Not hard at all  Food Insecurity: No Food Insecurity (06/29/2024)   Epic    Worried About Radiation Protection Practitioner of Food in the Last Year: Never true    Ran Out of Food in the Last Year: Never true  Transportation Needs: No Transportation Needs (06/29/2024)   Epic    Lack of Transportation (Medical): No    Lack of Transportation (Non-Medical): No  Physical Activity: Sufficiently Active (06/29/2024)   Exercise Vital Sign    Days of Exercise per Week: 6 days    Minutes of Exercise per Session: 60 min  Stress: Stress Concern Present (06/29/2024)   [employer] of Occupational Health - Occupational Stress Questionnaire    Feeling of Stress: Very much  Social Connections: Moderately Integrated (06/29/2024)   Social Connection and Isolation Panel    Frequency of Communication with Friends and Family: More than three times a week    Frequency of Social Gatherings with Friends and Family: More than three times a week    Attends Religious Services: More than 4 times per year    Active Member of Clubs or Organizations: No    Attends Banker Meetings: Not on file    Marital Status: Married  Depression (PHQ2-9): Low Risk (03/11/2024)   Depression (PHQ2-9)    PHQ-2 Score: 0  Alcohol Screen: Not on file  Housing: Low Risk (06/29/2024)   Epic    Unable to Pay for Housing in the Last Year: No    Number of Times Moved in the Last Year: 0    Homeless in the Last Year: No  Utilities: Low Risk (02/05/2024)   Received from [facility]   Utilities    In the past 12 months has the electric, gas, oil, or water company threatened to shut off services in your home? : No  Health Literacy: Not on file   Today's Vitals   06/30/24 0900  BP: (!) 140/80  Pulse: 76  Resp: 16  Temp: 98.2 F (36.8 C)  SpO2: 98%  Weight: 198 lb 12.8 oz (90.2 kg)   Height: 5' 6 (1.676 m)   Body mass index is 32.09 kg/m.  Physical Exam Vitals and nursing note reviewed.  Constitutional:      General: She is not in acute distress.    Appearance: She is well-developed.  HENT:     Head: Normocephalic and atraumatic.     Mouth/Throat:     Mouth: Mucous membranes are moist.     Pharynx: Oropharynx is clear.  Eyes:     Conjunctiva/sclera: Conjunctivae normal.  Cardiovascular:     Rate and Rhythm: Normal rate and regular rhythm.     Pulses:          Dorsalis pedis pulses are 2+ on the right side and 2+ on the  left side.     Heart sounds: No murmur heard. Pulmonary:     Effort: Pulmonary effort is normal. No respiratory distress.     Breath sounds: Normal breath sounds.  Abdominal:     Palpations: Abdomen is soft. There is no mass.     Tenderness: There is no abdominal tenderness.  Lymphadenopathy:     Cervical: No cervical adenopathy.  Skin:    General: Skin is warm.     Findings: No erythema or rash.  Neurological:     General: No focal deficit present.     Mental Status: She is alert and oriented to person, place, and time.     Cranial Nerves: No cranial nerve deficit.     Gait: Gait normal.  Psychiatric:        Mood and Affect: Mood and affect normal.   ASSESSMENT AND PLAN:  Ms. Jeanne Payne was seen today for hypertension and ear pain.  Diagnoses and all orders for this visit: Orders Placed This Encounter  Procedures   Hepatic function panel   Lipid panel   Lab Results  Component Value Date   CHOL 179 06/30/2024   HDL 46.60 06/30/2024   LDLCALC 108 (H) 06/30/2024   TRIG 123.0 06/30/2024   CHOLHDL 4 06/30/2024   Lab Results  Component Value Date   ALT 22 06/30/2024   AST 25 06/30/2024   ALKPHOS 47 06/30/2024   BILITOT 0.5 06/30/2024   Hyperlipidemia, unspecified hyperlipidemia type Assessment & Plan: Continue rosuvastatin  10 mg daily and low-fat diet. Further recommendation will be given according to  lipid panel result.  Orders: -     Hepatic function panel; Future -     Lipid panel; Future -     Basic Metabolic Panel Without GFR; Future  Essential (primary) hypertension Assessment & Plan: BP is not well controlled, although it has improved. Valsartan  80 mg added today. Continue amlodipine  5 mg daily. DASH diet recommended. Instructed to let me know BP readings in 2 to 3 weeks. BMP in 10 days. Follow-up in 4 months, before if needed.  Orders: -     Valsartan ; Take 1 tablet (80 mg total) by mouth daily.  Dispense: 30 tablet; Refill: 1 -     Hepatic function panel; Future -     Lipid panel; Future  Acute otitis externa of left ear, unspecified type Mild. We discussed differential diagnosis, other problems that can also contribute to otalgia as radiated pain from TMJ or dental problems. He is not sure about associated hearing changes, hearing screening here in the office was normal. Recommend topical antibiotic treatment, Ciprodex  twice daily in left ear for 7 to 10 days. Follow-up as needed.  -     Ciprofloxacin -dexAMETHasone ; Place 4 drops into the left ear 2 (two) times daily.  Dispense: 7.5 mL; Refill: 0  Hearing Screening   500Hz  1000Hz  2000Hz  4000Hz   Right ear Pass Pass Pass Pass  Left ear Pass Pass Pass Pass   Return in about 4 months (around 10/28/2024) for chronic problems.  Mercedes Fort, MD [facility]. [place] [facility].       [1]  Current Outpatient Medications on File Prior to Visit  Medication Sig Dispense Refill   amLODipine  (NORVASC ) 5 MG tablet Take 1 tablet (5 mg total) by mouth daily. 90 tablet 0   escitalopram  (LEXAPRO ) 20 MG tablet TAKE 1 TABLET(20 MG) BY MOUTH DAILY. Please arrange follow up prior to running out of medication. 90 tablet 3   rosuvastatin  (CRESTOR )  10 MG tablet Take 1 tablet (10 mg total) by mouth daily. 90 tablet 3   SUMAtriptan  (IMITREX ) 100 MG tablet TAKE 1 TABLET(100 MG) BY MOUTH DAILY AS NEEDED FOR MIGRAINE (Patient  taking differently: as needed. TAKE 1 TABLET(100 MG) BY MOUTH DAILY AS NEEDED FOR MIGRAINE) 10 tablet 2   meclizine  (ANTIVERT ) 25 MG tablet Take 1 tablet (25 mg total) by mouth at bedtime as needed for dizziness. (Patient not taking: Reported on 06/30/2024) 30 tablet 0   No current facility-administered medications on file prior to visit.  [2]  Allergies Allergen Reactions   Benadryl [Diphenhydramine]     Restless legs   "

## 2024-06-30 NOTE — Assessment & Plan Note (Addendum)
 BP is not well controlled, although it has improved. Valsartan  80 mg added today. Continue amlodipine  5 mg daily. DASH diet recommended. Instructed to let me know BP readings in 2 to 3 weeks. BMP in 10 days. Follow-up in 4 months, before if needed.

## 2024-06-30 NOTE — Patient Instructions (Addendum)
 A few things to remember from today's visit:  Hyperlipidemia, unspecified hyperlipidemia type  Essential (primary) hypertension - Plan: valsartan  (DIOVAN ) 80 MG tablet  Earache on left Today we added valsartan  80 mg daily. Continue amlodipine  5 mg daily. .  Let me know about blood pressure readings in 2 to 3 weeks. We need to change your kidney and your potassium in 10 days.  If you need refills for medications you take chronically, please call your pharmacy. Do not use My Chart to request refills or for acute issues that need immediate attention. If you send a my chart message, it may take a few days to be addressed, specially if I am not in the office.  Please be sure medication list is accurate. If a new problem present, please set up appointment sooner than planned today.

## 2024-07-06 ENCOUNTER — Ambulatory Visit
Admission: RE | Admit: 2024-07-06 | Discharge: 2024-07-06 | Disposition: A | Source: Ambulatory Visit | Attending: Family Medicine | Admitting: Family Medicine

## 2024-07-06 DIAGNOSIS — Z1239 Encounter for other screening for malignant neoplasm of breast: Secondary | ICD-10-CM

## 2024-07-10 ENCOUNTER — Other Ambulatory Visit (INDEPENDENT_AMBULATORY_CARE_PROVIDER_SITE_OTHER)

## 2024-07-10 DIAGNOSIS — E785 Hyperlipidemia, unspecified: Secondary | ICD-10-CM | POA: Diagnosis not present

## 2024-07-11 LAB — BASIC METABOLIC PANEL WITHOUT GFR
BUN: 8 mg/dL (ref 7–25)
CO2: 28 mmol/L (ref 20–32)
Calcium: 9.4 mg/dL (ref 8.6–10.4)
Chloride: 104 mmol/L (ref 98–110)
Creat: 0.87 mg/dL (ref 0.50–1.03)
Glucose, Bld: 91 mg/dL (ref 65–99)
Potassium: 4.3 mmol/L (ref 3.5–5.3)
Sodium: 140 mmol/L (ref 135–146)

## 2024-10-28 ENCOUNTER — Ambulatory Visit: Admitting: Family Medicine
